# Patient Record
Sex: Male | Born: 1975 | Race: White | Hispanic: No | Marital: Single | Smoking: Current every day smoker
Health system: Southern US, Community
[De-identification: ages and names within clinical notes are randomized; demographics above are authoritative.]

## PROBLEM LIST (undated history)

## (undated) DIAGNOSIS — E663 Overweight: Secondary | ICD-10-CM

## (undated) DIAGNOSIS — J45909 Unspecified asthma, uncomplicated: Secondary | ICD-10-CM

## (undated) DIAGNOSIS — R030 Elevated blood-pressure reading, without diagnosis of hypertension: Secondary | ICD-10-CM

## (undated) DIAGNOSIS — Z9189 Other specified personal risk factors, not elsewhere classified: Secondary | ICD-10-CM

## (undated) DIAGNOSIS — E785 Hyperlipidemia, unspecified: Secondary | ICD-10-CM

## (undated) DIAGNOSIS — F17200 Nicotine dependence, unspecified, uncomplicated: Secondary | ICD-10-CM

## (undated) HISTORY — DX: Other specified personal risk factors, not elsewhere classified: Z91.89

## (undated) HISTORY — DX: Elevated blood-pressure reading, without diagnosis of hypertension: R03.0

## (undated) HISTORY — DX: Hyperlipidemia, unspecified: E78.5

## (undated) HISTORY — DX: Unspecified asthma, uncomplicated: J45.909

## (undated) HISTORY — DX: Nicotine dependence, unspecified, uncomplicated: F17.200

## (undated) HISTORY — DX: Overweight: E66.3

## (undated) HISTORY — PX: TONSILLECTOMY: SUR1361

---

## 2008-04-03 ENCOUNTER — Emergency Department: Payer: Self-pay | Admitting: Emergency Medicine

## 2015-10-24 LAB — LIPID PANEL
Cholesterol: 227 — AB (ref 0–200)
HDL: 30 — AB (ref 35–70)
LDL Cholesterol: 126
Triglycerides: 357 — AB (ref 40–160)

## 2016-02-12 ENCOUNTER — Ambulatory Visit
Admission: RE | Admit: 2016-02-12 | Discharge: 2016-02-12 | Disposition: A | Discharge status: Home / Self Care | Payer: BLUE CROSS/BLUE SHIELD | Source: Ambulatory Visit | Attending: Family Medicine | Admitting: Family Medicine

## 2016-02-12 ENCOUNTER — Other Ambulatory Visit: Payer: Self-pay | Admitting: Family Medicine

## 2016-02-12 DIAGNOSIS — R1013 Epigastric pain: Secondary | ICD-10-CM | POA: Insufficient documentation

## 2016-02-12 MED ORDER — IOHEXOL 300 MG/ML  SOLN
125.0000 mL | Freq: Once | INTRAMUSCULAR | Status: AC | PRN
Start: 2016-02-12 — End: 2016-02-12
  Administered 2016-02-12: 125 mL via INTRAVENOUS

## 2017-09-02 IMAGING — CT CT ABD-PELV W/ CM
1 of 3 series · 14 of 32 positions shown, 19 images · IV contrast (omnipaque)
Comparison: None.

CLINICAL DATA: Epigastric to back pain x 2 days-worse with eating
and drinking, denies further symptoms, no hx of surgery.

EXAM:
CT ABDOMEN AND PELVIS WITH CONTRAST
TECHNIQUE: Multidetector CT imaging of the abdomen and pelvis was performed
using the standard protocol following bolus administration of
intravenous contrast.
CONTRAST:  125mL OMNIPAQUE IOHEXOL 300 MG/ML  SOLN

[Series 2: routine abd pel with · axial · 0.74mm/px · z∈[-632,-187]mm · 14 of 101 slices shown, 19 images]
[im 6/101  soft-tissue]
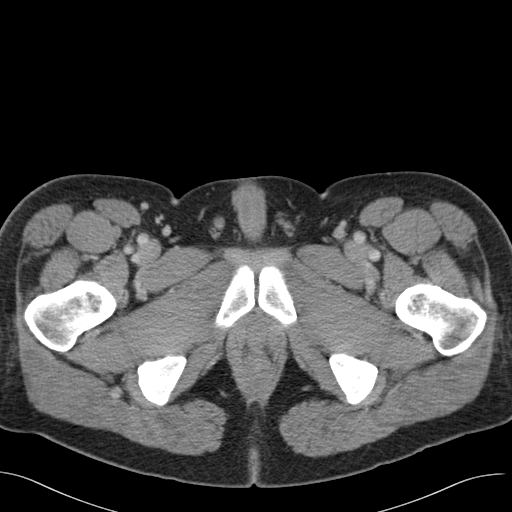
[im 6/101  bone]
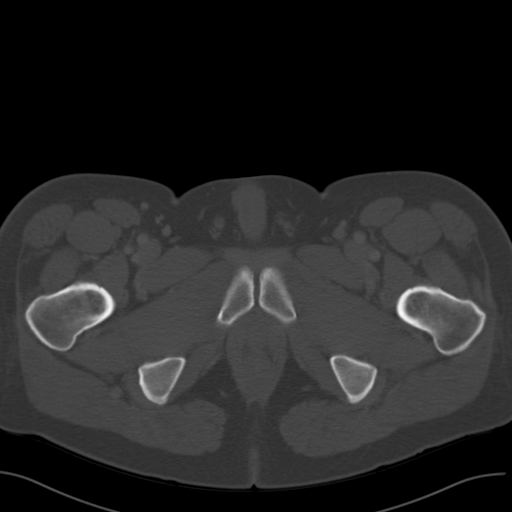
[im 16/101  soft-tissue]
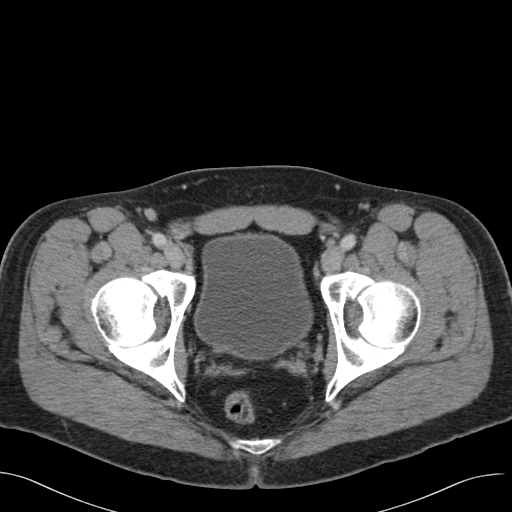
[im 22/101  soft-tissue]
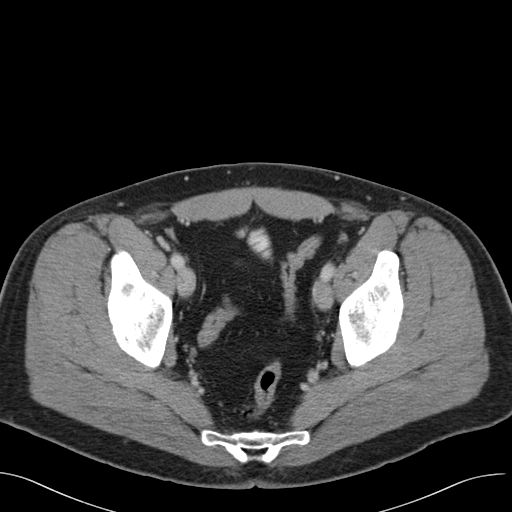
[im 27/101  soft-tissue]
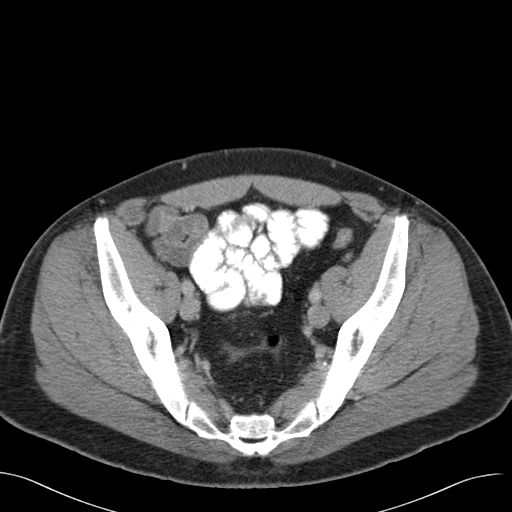
[im 37/101  soft-tissue]
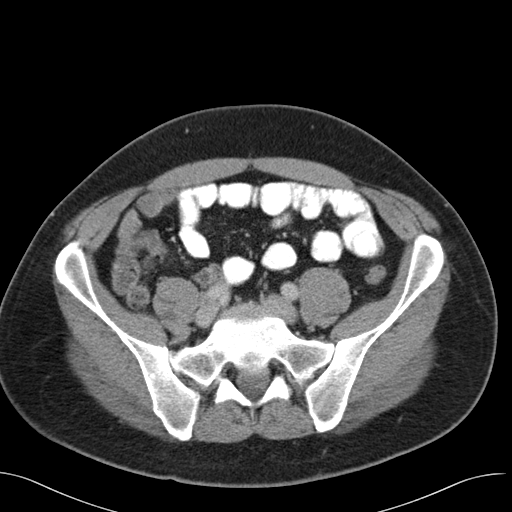
[im 43/101  soft-tissue]
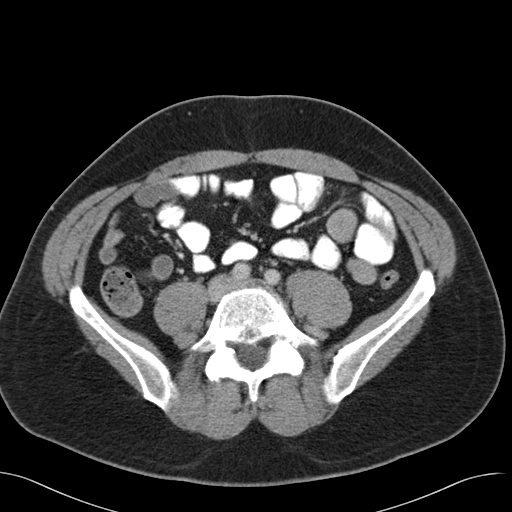
[im 53/101  soft-tissue]
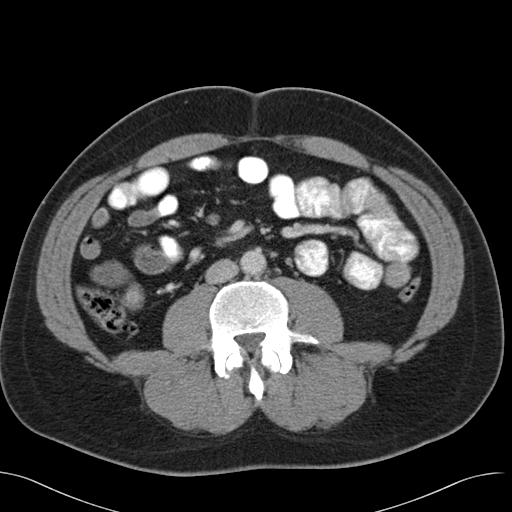
[im 58/101  soft-tissue]
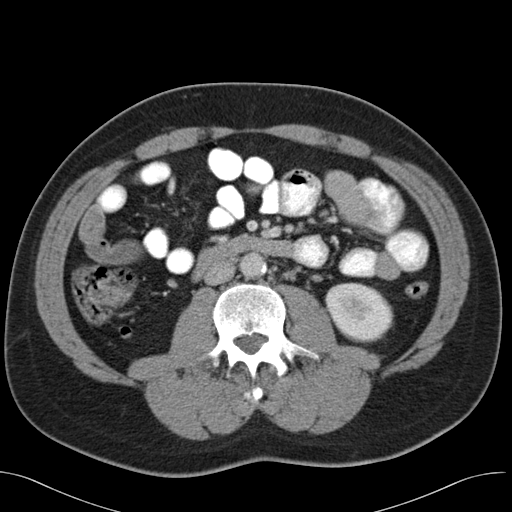
[im 64/101  soft-tissue]
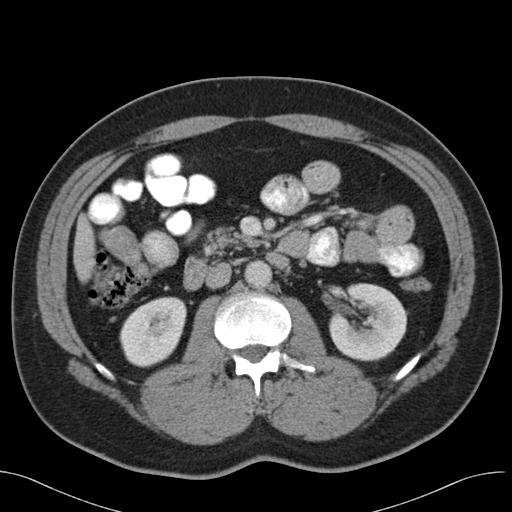
[im 64/101  bone]
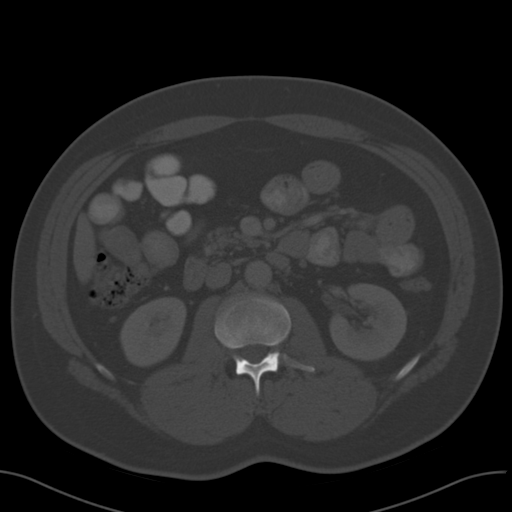
[im 74/101  soft-tissue]
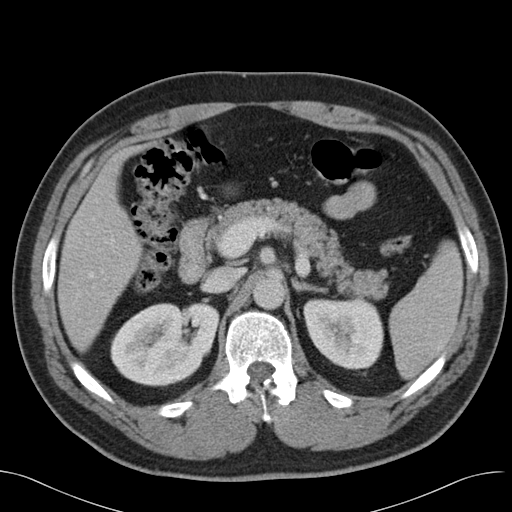
[im 79/101  soft-tissue]
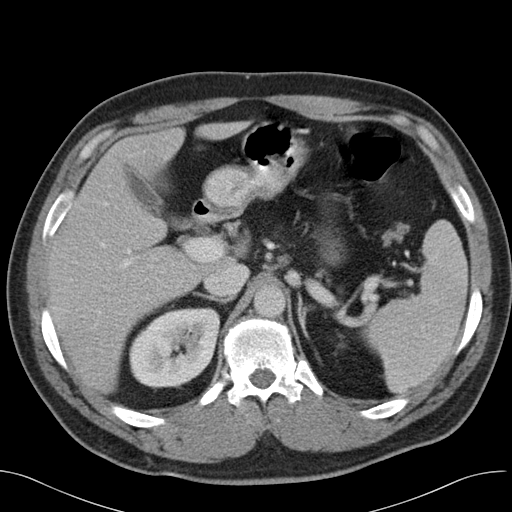
[im 79/101  lung]
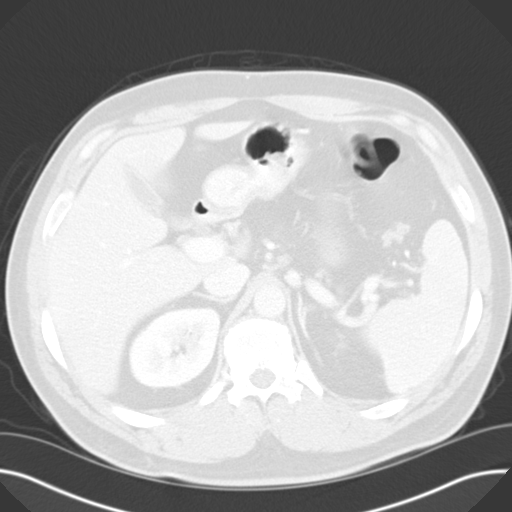
[im 85/101  soft-tissue]
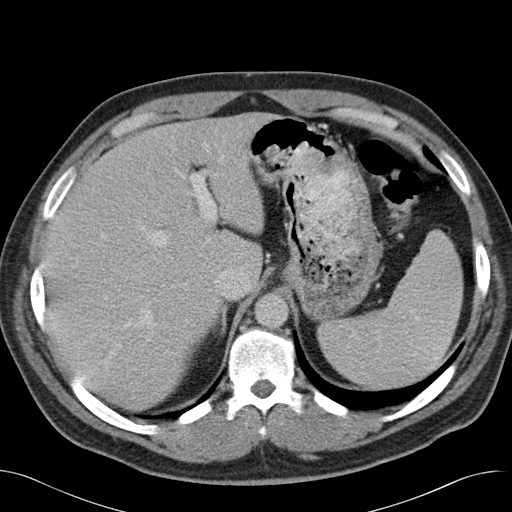
[im 85/101  lung]
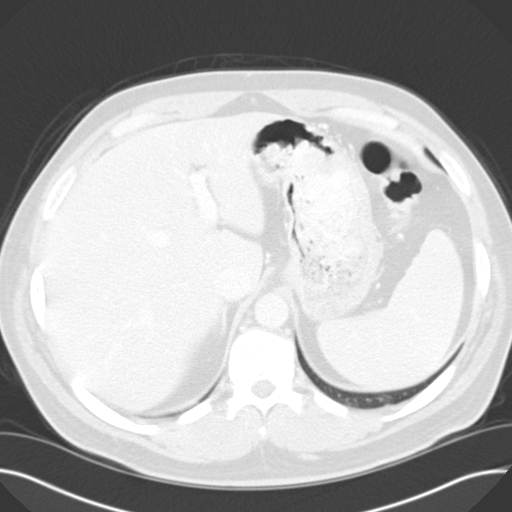
[im 90/101  lung]
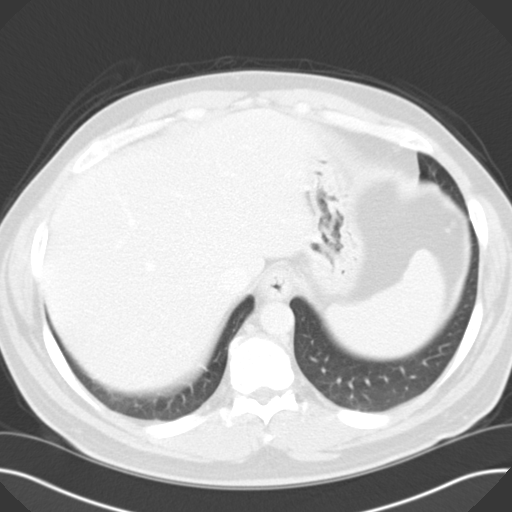
[im 95/101  soft-tissue]
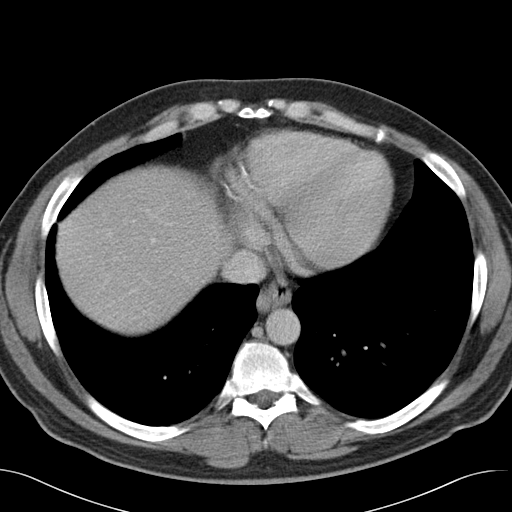
[im 95/101  lung]
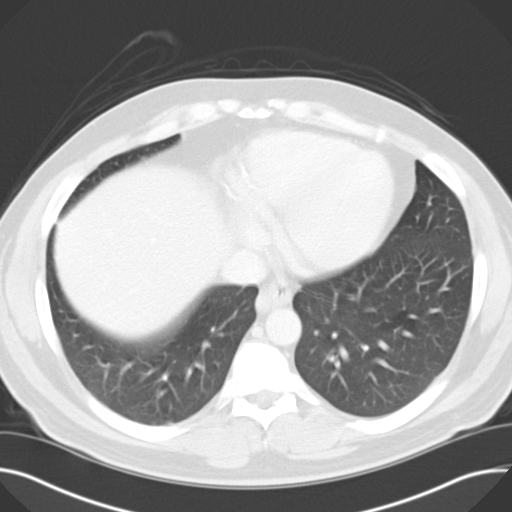

[14 of 32 positions shown; findings below may reference images not displayed]

FINDINGS: Lung bases:  Clear.  Heart normal size.

Liver, spleen, gallbladder, pancreas, adrenal glands:  Normal.

Kidneys, ureters, bladder:  Normal.

Lymph nodes: Borderline enlarged, 10 mm short axis, lymph node
adjacent to the right aspect of the distal esophagus, most likely
reactive. No other adenopathy.

Ascites: None

Gastrointestinal: Stomach is unremarkable. Small bowel and colon are
within normal limits. Normal appendix visualized.

Musculoskeletal:  Unremarkable.
IMPRESSION: 1. No acute findings. No findings to explain this patient's current
symptoms.
2. Essentially normal exam.  Normal appendix visualized.

## 2017-11-26 LAB — LIPID PANEL
Cholesterol: 233 — AB (ref 0–200)
HDL: 38 (ref 35–70)
LDL Cholesterol: 163
Triglycerides: 160 (ref 40–160)

## 2017-11-27 ENCOUNTER — Other Ambulatory Visit: Payer: Self-pay | Admitting: Family Medicine

## 2017-11-27 DIAGNOSIS — R079 Chest pain, unspecified: Secondary | ICD-10-CM

## 2017-12-01 ENCOUNTER — Encounter
Admission: RE | Admit: 2017-12-01 | Discharge: 2017-12-01 | Disposition: A | Discharge status: Home / Self Care | Payer: BLUE CROSS/BLUE SHIELD | Source: Ambulatory Visit | Attending: Family Medicine | Admitting: Family Medicine

## 2017-12-01 DIAGNOSIS — R079 Chest pain, unspecified: Secondary | ICD-10-CM | POA: Diagnosis not present

## 2017-12-01 MED ORDER — TECHNETIUM TC 99M TETROFOSMIN IV KIT
13.0000 | PACK | Freq: Once | INTRAVENOUS | Status: AC | PRN
  Administered 2017-12-01: 12.34 via INTRAVENOUS

## 2017-12-01 MED ORDER — TECHNETIUM TC 99M TETROFOSMIN IV KIT
30.0000 | PACK | Freq: Once | INTRAVENOUS | Status: AC | PRN
  Administered 2017-12-01: 31.7 via INTRAVENOUS

## 2017-12-02 ENCOUNTER — Encounter: Payer: Self-pay | Admitting: Cardiovascular Disease

## 2017-12-02 ENCOUNTER — Ambulatory Visit (INDEPENDENT_AMBULATORY_CARE_PROVIDER_SITE_OTHER): Payer: BLUE CROSS/BLUE SHIELD | Admitting: Cardiovascular Disease

## 2017-12-02 VITALS — BP 136/96 | HR 66 | Ht 75.5 in | Wt 258.8 lb

## 2017-12-02 DIAGNOSIS — Z720 Tobacco use: Secondary | ICD-10-CM

## 2017-12-02 DIAGNOSIS — R0602 Shortness of breath: Secondary | ICD-10-CM | POA: Diagnosis not present

## 2017-12-02 DIAGNOSIS — E785 Hyperlipidemia, unspecified: Secondary | ICD-10-CM

## 2017-12-02 LAB — NM MYOCAR MULTI W/SPECT W/WALL MOTION / EF
CHL CUP NUCLEAR SSS: 0
CHL CUP STRESS STAGE 1 HR: 72 {beats}/min
CHL CUP STRESS STAGE 2 DBP: 89 mmHg
CHL CUP STRESS STAGE 2 SPEED: 1.7 mph
CHL CUP STRESS STAGE 3 SPEED: 2.5 mph
CHL CUP STRESS STAGE 4 DBP: 102 mmHg
CHL CUP STRESS STAGE 4 GRADE: 14 %
CHL CUP STRESS STAGE 5 HR: 160 {beats}/min
CHL CUP STRESS STAGE 5 SPEED: 4.2 mph
CHL CUP STRESS STAGE 6 DBP: 91 mmHg
CHL CUP STRESS STAGE 6 GRADE: 0 %
CHL CUP STRESS STAGE 6 SBP: 231 mmHg
CHL CUP STRESS STAGE 6 SPEED: 0 mph
CHL CUP STRESS STAGE 7 DBP: 99 mmHg
CHL CUP STRESS STAGE 7 SPEED: 0 mph
CSEPED: 11 min
CSEPEDS: 53 s
CSEPEW: 13.4 METS
CSEPPHR: 160 {beats}/min
CSEPPMHR: 89 %
LV dias vol: 151 mL (ref 62–150)
LVSYSVOL: 74 mL
Rest HR: 86 {beats}/min
SDS: 0
SRS: 2
Stage 1 Grade: 0 %
Stage 1 Speed: 0.1 mph
Stage 2 Grade: 10 %
Stage 2 HR: 99 {beats}/min
Stage 2 SBP: 187 mmHg
Stage 3 Grade: 12 %
Stage 3 HR: 118 {beats}/min
Stage 4 HR: 134 {beats}/min
Stage 4 SBP: 209 mmHg
Stage 4 Speed: 3.4 mph
Stage 5 Grade: 16 %
Stage 6 HR: 127 {beats}/min
Stage 7 Grade: 0 %
Stage 7 HR: 88 {beats}/min
Stage 7 SBP: 183 mmHg
TID: 0.89

## 2017-12-02 NOTE — Patient Instructions (Addendum)
Medication Instructions:  Your physician recommends that you continue on your current medications as directed. Please refer to the Current Medication list given to you today.   Labwork: none  Testing/Procedures: Your physician has requested that you have an echocardiogram. Echocardiography is a painless test that uses sound waves to create images of your heart. It provides your doctor with information about the size and shape of your heart and how well your heart's chambers and valves are working. This procedure takes approximately one hour. There are no restrictions for this procedure.    Follow-Up: Your physician recommends that you schedule a follow-up appointment as needed.    Any Other Special Instructions Will Be Listed Below (If Applicable).     If you need a refill on your cardiac medications before your next appointment, please call your pharmacy.  Echocardiogram An echocardiogram, or echocardiography, uses sound waves (ultrasound) to produce an image of your heart. The echocardiogram is simple, painless, obtained within a short period of time, and offers valuable information to your health care provider. The images from an echocardiogram can provide information such as:  Evidence of coronary artery disease (CAD).  Heart size.  Heart muscle function.  Heart valve function.  Aneurysm detection.  Evidence of a past heart attack.  Fluid buildup around the heart.  Heart muscle thickening.  Assess heart valve function.  Tell a health care provider about:  Any allergies you have.  All medicines you are taking, including vitamins, herbs, eye drops, creams, and over-the-counter medicines.  Any problems you or family members have had with anesthetic medicines.  Any blood disorders you have.  Any surgeries you have had.  Any medical conditions you have.  Whether you are pregnant or may be pregnant. What happens before the procedure? No special preparation is  needed. Eat and drink normally. What happens during the procedure?  In order to produce an image of your heart, gel will be applied to your chest and a wand-like tool (transducer) will be moved over your chest. The gel will help transmit the sound waves from the transducer. The sound waves will harmlessly bounce off your heart to allow the heart images to be captured in real-time motion. These images will then be recorded.  You may need an IV to receive a medicine that improves the quality of the pictures. What happens after the procedure? You may return to your normal schedule including diet, activities, and medicines, unless your health care provider tells you otherwise. This information is not intended to replace advice given to you by your health care provider. Make sure you discuss any questions you have with your health care provider. Document Released: 11/29/2000 Document Revised: 07/20/2016 Document Reviewed: 08/09/2013 Elsevier Interactive Patient Education  2017 [employer] Inc.  

## 2017-12-02 NOTE — Progress Notes (Signed)
Cardiology Office Note   Date:  12/02/2017   ID:  Brandon Kim, DOB [birth year], MRN [id number]  PCP:  Brandon Kim, Brandon Kim, Brandon Kim  Cardiologist:   Brandon BearsMuhammad Arida, Brandon Kim   Chief Complaint  Patient presents with  . OTHER    Abn. stress test. Meds reviewed verbally with pt.      History of Present Illness: Brandon Kim is a 41 y.o. male who was referred by Dr. Ellin Goodieabinowitz for evaluation of an abnormal EKG and stress test.  The patient has no prior cardiac history.  He has known history of hyperlipidemia on atorvastatin and tobacco use.  He smokes 1 pack/day and has been doing so since he was in his early 1520s.  His mother had myocardial infarction at the age of 42 with subsequent CABG.  She died at the age of 42 of heart disease. The patient was recently noted to have an abnormal EKG.  He was referred for a treadmill nuclear stress test.  He was able to exercise for 13 minutes without symptoms.  Perfusion was suboptimal but showed a fixed inferior wall defect likely diaphragm attenuation.  Ejection fraction was read as 41%. The patient denies any chest pain, shortness of breath or palpitations.    Past Medical History:  Diagnosis Date  . Hyperlipidemia     History reviewed. No pertinent surgical history.   Current Outpatient Medications  Medication Sig Dispense Refill  . atorvastatin (LIPITOR) 20 MG tablet Take 20 mg by mouth at bedtime.  1   No current facility-administered medications for this visit.     Allergies:   Patient has no known allergies.    Social History:  The patient  reports that he has been smoking cigarettes.  He has a 20.00 pack-year smoking history. he has never used smokeless tobacco. He reports that he drinks alcohol. He reports that he does not use drugs.   Family History:  The patient's family history includes Heart attack in his mother; Heart disease in his mother.    ROS:  Please see the history of present illness.   Otherwise, review of systems are  positive for none.   All other systems are reviewed and negative.    PHYSICAL EXAM: VS:  BP (!) 136/96 (BP Location: Right Arm, Patient Position: Sitting, Cuff Size: Normal)   Pulse 66   Ht 6' 3.5" (1.918 m)   Wt 258 lb 12 oz (117.4 kg)   BMI 31.91 kg/m  , BMI Body mass index is 31.91 kg/m. GEN: Well nourished, well developed, in no acute distress  HEENT: normal  Neck: no JVD, carotid bruits, or masses Cardiac: RRR; no murmurs, rubs, or gallops,no edema  Respiratory:  clear to auscultation bilaterally, normal work of breathing GI: soft, nontender, nondistended, + BS MS: no deformity or atrophy  Skin: warm and dry, no rash Neuro:  Strength and sensation are intact Psych: euthymic mood, full affect   EKG:  EKG is ordered today. The ekg ordered today demonstrates normal sinus rhythm with no significant ST or T wave changes.   Recent Labs: No results found for requested labs within last 8760 hours.    Lipid Panel No results found for: CHOL, TRIG, HDL, CHOLHDL, VLDL, LDLCALC, LDLDIRECT    Wt Readings from Last 3 Encounters:  12/02/17 258 lb 12 oz (117.4 kg)  12/01/17 254 lb (115.2 kg)      Other studies Reviewed:   PAD Screen 12/02/2017  Previous PAD dx? No  Previous surgical procedure? No  Pain with walking? No  Feet/toe relief with dangling? No  Painful, non-healing ulcers? No  Extremities discolored? No      ASSESSMENT AND PLAN:  1.  Non specific abnormal cardiovascular testing: I reviewed the patient's EKG from today and it  is completely normal.  I personally reviewed his nuclear stress test images.  The patient was able to exercise for 13 minutes with no symptoms or EKG changes. Perfusion showed fixed inferior wall defect consistent with diaphragm attenuation artifact with no convincing evidence of ischemia or infarct. Ejection fraction was reported to be 41%.  However, I do suspect that this was not an accurate calculation based on poor tracking of LV wall  borders.  His left ventricle did appear to be mildly dilated.  I requested an echocardiogram to evaluate ejection fraction and wall motion. If echo shows normal ejection fraction and wall motion, no further cardiac workup is recommended. I discussed with him the importance of treating his risk factors.  2.  Hyperlipidemia: Currently on atorvastatin.  3.  Tobacco use: I discussed with him the importance of smoking cessation and he is determined to quit smoking.    Disposition:   FU with me as needed.   Signed,  Brandon BearsMuhammad Arida, Brandon Kim  12/02/2017 3:51 PM    [facility]

## 2017-12-04 ENCOUNTER — Ambulatory Visit (INDEPENDENT_AMBULATORY_CARE_PROVIDER_SITE_OTHER): Payer: BLUE CROSS/BLUE SHIELD

## 2017-12-04 ENCOUNTER — Other Ambulatory Visit: Payer: Self-pay

## 2017-12-04 DIAGNOSIS — R0602 Shortness of breath: Secondary | ICD-10-CM

## 2019-02-16 LAB — LIPID PANEL
Cholesterol: 264 — AB (ref 0–200)
HDL: 31 — AB (ref 35–70)
LDL Cholesterol: 197
Triglycerides: 178 — AB (ref 40–160)

## 2019-06-02 ENCOUNTER — Ambulatory Visit: Payer: Self-pay | Admitting: Internal Medicine

## 2019-08-10 ENCOUNTER — Encounter: Payer: Self-pay | Admitting: Physician Assistant

## 2019-08-10 ENCOUNTER — Other Ambulatory Visit: Payer: Self-pay

## 2019-08-10 ENCOUNTER — Ambulatory Visit: Payer: 59 | Admitting: Physician Assistant

## 2019-08-10 VITALS — BP 141/88 | HR 63 | Temp 98.7°F | Resp 16 | Ht 76.0 in | Wt 248.0 lb

## 2019-08-10 DIAGNOSIS — L247 Irritant contact dermatitis due to plants, except food: Secondary | ICD-10-CM

## 2019-08-10 MED ORDER — CETIRIZINE HCL 10 MG PO TABS: 10.0000 mg | ORAL_TABLET | Freq: Two times a day (BID) | ORAL | Status: DC

## 2019-08-10 MED ORDER — RANITIDINE HCL 75 MG PO TABS: 150.0000 mg | ORAL_TABLET | Freq: Two times a day (BID) | ORAL | 0 refills | Status: DC

## 2019-08-10 MED ORDER — DIPHENHYDRAMINE HCL 25 MG PO CAPS: 25.0000 mg | ORAL_CAPSULE | Freq: Every evening | ORAL | 0 refills | Status: DC | PRN

## 2019-08-10 NOTE — Progress Notes (Signed)
   Subjective:    Patient ID: Brandon Kim, male    DOB: 08/25/1976, 43 y.o.   MRN: [id number]  HPI  Clearing brush in yard about 7-10 days ago with pruritic rash noted on hands ,lower legs and midrif in the days following yard project. Had a few days of significant itching and discomfort which has now resolved  He has 2 blistered areas remaining on Left 4 and Right 4-5 fingers. These areas are reportedly improved over yesterday and moreso over days before.  No longer itch and ROM now normal after previously being limited by some swelling by his report  [employer] ..his reference "diet cigarettes" and had one on way to office. BP elevated on arrival and improved on retake. Good example of cause effect discussed     Review of Systems Negative except as described above    Objective:   Physical Exam   WDWN male with aging dermatitis noted on fingers Left 4 and right 5-6. Blisters present but drying , erythematous bases persist. ROM good bilaterally Areas reported as involved on his legs and side have resolved and no longer visible          Assessment & Plan:   DIscussed option of prednisone taper but given limited area of concern and his report of steady improvement he prefers to use topical intervention.   Desire is to get back outside and finish his project. Outbreak needs to be protected because of skin fragility and allowed to heal. Encourage waiting a few more days for project. In the interim may use topical Bacitracin to keep soft and minimize infection until skin integrity improves. Finger wrap bandaids can be utilized with the Ointment. Garden gloves strongly encouraged for present and future  Smoking cessation encouraged but his past history yielded a significant weight gain last time he stopped. Stepwise reduction with a focus on an exercise routine that can be managed and consistent encouraged.

## 2019-08-24 ENCOUNTER — Other Ambulatory Visit: Payer: Self-pay

## 2019-08-24 ENCOUNTER — Ambulatory Visit: Payer: 59 | Admitting: Internal Medicine

## 2019-08-24 ENCOUNTER — Encounter: Payer: Self-pay | Admitting: Internal Medicine

## 2019-08-24 VITALS — BP 173/115 | HR 81 | Temp 98.9°F | Resp 14 | Ht 76.0 in | Wt 266.0 lb

## 2019-08-24 DIAGNOSIS — R030 Elevated blood-pressure reading, without diagnosis of hypertension: Secondary | ICD-10-CM | POA: Insufficient documentation

## 2019-08-24 DIAGNOSIS — H5789 Other specified disorders of eye and adnexa: Secondary | ICD-10-CM | POA: Insufficient documentation

## 2019-08-24 DIAGNOSIS — S50319A Abrasion of unspecified elbow, initial encounter: Secondary | ICD-10-CM | POA: Insufficient documentation

## 2019-08-24 NOTE — Progress Notes (Signed)
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  It was a thicker glass with out much fine shards that resulted, more of just a break and a laceration.  He noted he washed it out very good after, made sure there was no glass in any cuts, and did not go to an emergency setting as he was concerned about the cost and insurance.  He had 2 small flap areas that resulted from this, that he was aware may have needed sutures, although as we discussed, cannot be sutured today given the time duration since the event.  He notes that it has remained keeping it clean, no marked redness around it, still is uncomfortable around the 2 flap type areas and from the fall landing on his elbow, although it is not a pain problem presently.  He wanted to get checked to make sure there was no infection concerns and to see if he may need antibiotics.  He does not have a history of high blood pressure, is not on medicines for that, and was seen here on 8/25 by the PA, and his blood pressure then was 141/88.  He also noted that he could not find his contact on his right eye.  It did not feel like something was still in the eye, although he irrigated the eye out after this happened, and he did not find his contact, although he does not think it is still in his eye, although was not sure of that.  It today is a little sore in the corner of the eye, although that could be from him thoroughly irrigating and trying to make sure there was no contact that remained in place.  His eye was not red.  It also was not painful.  No Known Allergies Current Outpatient Medications on File Prior to Visit  Medication Sig Dispense Refill  . atorvastatin (LIPITOR) 40 MG tablet Take 40 mg by mouth daily.    . cetirizine (ZYRTEC) 10 MG tablet Take 1 tablet (10 mg total) by mouth 2 (two) times daily for 3 days.     . diphenhydrAMINE (BENADRYL ALLERGY) 25 mg capsule Take 1-2 capsules (25-50 mg total) by mouth at bedtime as needed for up to 3 days for itching.  0  . ranitidine (ZANTAC 75) 75 MG tablet Take 2 tablets (150 mg total) by mouth 2 (two) times daily for 3 days.  0   No current facility-administered medications on file prior to visit.    He is a current every day smoker.    Last Tdap - 2014  O - NAD, masked  BP (!) 173/115 (BP Location: Right Arm, Patient Position: Sitting, Cuff Size: Large)   Pulse 81   Temp 98.9 F (37.2 C) (Oral)   Resp 14   Ht 6\' 4"  (1.93 m)   Wt 266 lb (120.7 kg)   SpO2 98%   BMI 32.38 kg/m   BP 141/88 on visit 08/10/2019  H EENT- the right eye was not injected, with the pupil equal and reactive, the upper lid was inverted, with no obvious contact located, and in the very right corner of the eye, beneath the lid looked more like a fold than a contact.  I thoroughly irrigated the eye with sterile saline several times, with no foreign body like a contact removed.  The upper  outer lid had some faint erythema and was sore more than tender palpating, with a question of some very slight irritation/swelling on that very distal outer lid region. The left eye had the contact in place with the conjunctiva noninjected.  LUE -the left elbow had good range of motion without marked swelling.  To semicircular abrasions were present, the larger 1 above the elbow in the tricep region and the smaller one even more cephalad, with both having flaps that were in place and well approximated, with no marked erythema surrounding.  There was no purulent drainage, mildly tender palpating the area surrounding the semicircular abrasions.  There were scattered superficial abrasions adjacent to these 2 regions His sensation was intact in the distal left upper extremity. He had tattoos on his upper extremities as well.  His affect was not flat, he was very appropriate with  conversation.   Ass/Plan:  1. Abrasions, left elbow (happened Sunday) -given the timeframe from when the lacerations occurred, we could not suture them today and he was aware of that.  The 2 flap areas seem to be healing quite nicely, and with Leisha's help.  The areas were cleansed, and Steri-Strips applied to reinforce the 2 flap areas and then loosely covered, and would keep loosely covered in the short-term.  Also to keep clean with warm soapy water recommended.  A tetanus booster was felt needed as his last tetanus was in 2014, and that was given today. To follow-up immediately if any signs of infection or rising, and that was reviewed today.  Do not feel adding an empiric antibiotic is needed at this point.  2. Increased BP with out the dx of HTN -concerned with his blood pressure today, although reassured that he was just here in late August and his blood pressure was borderline at that point, much better than today.  Do feel the events above contributed to his pressure being up some today.  We will continue to monitor at this point, but do feel follow-up blood pressure is in order here in the near future, and that can be obtained.  3. Right eye irritation - unclear if contact out noted by patient, not feel like something is in the eye and he did irrigate it out and did not find contact after.  There was no obvious contact still present in the eye or other foreign body, although did note that sometimes these can fold and really be hard to identify, especially in the corner of the eye.  He was also aware that.  Emphasized if it starts to feel like there may be something more in the eye, to irrigate with sterile saline eye wash given to him to do so today, and would follow-up with his eye doctor as he notes he has one that he sees routinely.  Also, if he is starting to get more irritation or more redness of the eye, I would recommend being seen as well.

## 2019-08-24 NOTE — Patient Instructions (Signed)
Abrasion  An abrasion is a cut or a scrape on the surface of your skin. An abrasion does not go through all the layers of your skin. It is important to take good care of your abrasion to prevent infection. Follow these instructions at home: Medicines  Take or apply over-the-counter and prescription medicines only as told by your doctor.  If you were prescribed an antibiotic medicine, apply it as told by your doctor. Do not stop using the antibiotic even if you start to feel better. Wound care  Clean the wound 2-3 times a day or as often as told by your doctor. To do this: ? Wash the wound with mild soap and water. ? Rinse off the soap. ? Pat a clean towel on the wound to dry it. Do not rub it.  Keep the bandage (dressing) clean and dry as told by your doctor.  Follow instructions from your doctor about how to take care of your wound. Make sure you: ? Wash your hands with soap and water before you change your bandage. If you cannot use soap and water, use hand sanitizer. ? Change your bandage as told by your doctor.  Check your wound every day for signs of infection. Check for: ? Redness, swelling, or pain. ? Fluid or blood. ? Warmth. ? Pus or a bad smell.  If directed, put ice on the injured area. To do this: ? Put ice in a plastic bag. ? Place a towel between your skin and the bag. ? Leave the ice on for 20 minutes, 2-3 times a day. General instructions  Do not take baths, swim, or use a hot tub until your doctor says it is okay.  If there is swelling, raise (elevate) the injured area above the level of your heart while you are sitting or lying down.  Keep all follow-up visits as told by your doctor. This is important. Contact a doctor if:  You were given a tetanus shot, and you have any of these where the needle went in: ? Swelling. ? Very bad pain. ? Redness. ? Bleeding.  You have a lot of pain, and medicine does not help.  You have any of these at the site of the  wound: ? More redness. ? More swelling. ? More pain. Get help right away if:  You have a red streak going away from your wound.  You have a fever.  You have fluid, blood, or pus coming from your wound.  There is a bad smell coming from your wound or bandage. Summary  An abrasion is a cut or a scrape on the surface of your skin.  Take good care of your abrasion so it does not get infected.  Clean the wound with mild soap and water, and change your bandage as told by your doctor.  Call your doctor if you have redness, swelling, or more pain in your wound.  Get help right away if you have a fever or if you have fluid, blood, pus, a bad smell, or a red streak coming from the wound. This information is not intended to replace advice given to you by your health care provider. Make sure you discuss any questions you have with your health care provider. Document Released: 05/20/2008 Document Revised: 11/14/2017 Document Reviewed: 07/24/2017 Elsevier Patient Education  2020 Elsevier Inc.  

## 2019-11-26 DIAGNOSIS — Z20828 Contact with and (suspected) exposure to other viral communicable diseases: Secondary | ICD-10-CM | POA: Diagnosis not present

## 2019-12-03 DIAGNOSIS — Z20828 Contact with and (suspected) exposure to other viral communicable diseases: Secondary | ICD-10-CM | POA: Diagnosis not present

## 2020-03-03 DIAGNOSIS — H52223 Regular astigmatism, bilateral: Secondary | ICD-10-CM | POA: Diagnosis not present

## 2020-04-11 ENCOUNTER — Encounter: Payer: Self-pay | Admitting: Physician Assistant

## 2020-04-11 ENCOUNTER — Ambulatory Visit: Payer: 59 | Admitting: Physician Assistant

## 2020-04-11 ENCOUNTER — Other Ambulatory Visit: Payer: Self-pay

## 2020-04-11 VITALS — BP 141/93 | HR 88 | Temp 97.3°F | Resp 16 | Ht 75.5 in | Wt 243.0 lb

## 2020-04-11 DIAGNOSIS — W57XXXA Bitten or stung by nonvenomous insect and other nonvenomous arthropods, initial encounter: Secondary | ICD-10-CM

## 2020-04-11 MED ORDER — SULFAMETHOXAZOLE-TRIMETHOPRIM 800-160 MG PO TABS: 1.0000 | ORAL_TABLET | Freq: Two times a day (BID) | ORAL | 0 refills | Status: DC

## 2020-04-11 MED ORDER — HYDROXYZINE HCL 50 MG PO TABS
50.0000 mg | ORAL_TABLET | Freq: Three times a day (TID) | ORAL | 0 refills | Status: DC | PRN
Start: 2020-04-11 — End: 2020-11-27

## 2020-04-11 NOTE — Progress Notes (Signed)
Just smoke a cigarette prior to coming to clinic.  Noticed redness & swelling to right inner forearm this morning when he woke up.  Reddened area approximately 3.5 inches diameter with dark center.  Swollen & warm to touch. Tender to touch.  States he was working outside yesterday doing yard work.  AMD

## 2020-04-11 NOTE — Progress Notes (Signed)
   Subjective: Insect bite    Patient ID: Brandon Kim, male    DOB: 1976/06/25, 44 y.o.   MRN: [id number]  HPI Patient presents insect bite to the right forearm.  Patient state he was doing yard work yesterday.  Patient did not feel the bite.  Working this morning with redness and swelling to the right forearm.  Patient states there is mild pain which he rates at 3/10.  Patient denies loss of sensation or function.   Review of Systems Hyperlipidemia and seasonal allergies.    Objective:   Physical Exam No acute distress.  There is a puncture wound on erythematous base.  Erythema measures approximately 6 cm.       Assessment & Plan:  Infected insect bite.  Patient given discharge care instruction advised take medication as directed.  Return back if condition worsens.

## 2020-05-03 ENCOUNTER — Other Ambulatory Visit: Payer: Self-pay | Admitting: Physician Assistant

## 2020-05-12 NOTE — Progress Notes (Signed)
Scheduled to complete physical 05/23/20.  Provider TBD  AMD 

## 2020-05-16 ENCOUNTER — Other Ambulatory Visit: Payer: Self-pay

## 2020-05-16 ENCOUNTER — Ambulatory Visit: Payer: Self-pay

## 2020-05-16 DIAGNOSIS — Z0000 Encounter for general adult medical examination without abnormal findings: Secondary | ICD-10-CM

## 2020-05-16 LAB — POCT URINALYSIS DIPSTICK
Bilirubin, UA: NEGATIVE
Blood, UA: NEGATIVE
Glucose, UA: NEGATIVE
Ketones, UA: NEGATIVE
Leukocytes, UA: NEGATIVE
Nitrite, UA: NEGATIVE
Protein, UA: POSITIVE — AB
Spec Grav, UA: >=1.03 — AB (ref 1.010–1.025)
Urobilinogen, UA: 0.2 E.U./dL
pH, UA: 5.5 (ref 5.0–8.0)

## 2020-05-17 LAB — CMP12+LP+TP+TSH+6AC+PSA+CBC…
ALT: 31 IU/L (ref 0–44)
AST: 18 IU/L (ref 0–40)
Albumin/Globulin Ratio: 1.9 (ref 1.2–2.2)
Albumin: 4.3 g/dL (ref 4.0–5.0)
Alkaline Phosphatase: 78 IU/L (ref 48–121)
BUN/Creatinine Ratio: 17 (ref 9–20)
BUN: 15 mg/dL (ref 6–24)
Basophils Absolute: 0.1 10*3/uL (ref 0.0–0.2)
Basos: 1 %
Bilirubin Total: 0.5 mg/dL (ref 0.0–1.2)
Calcium: 9.2 mg/dL (ref 8.7–10.2)
Chloride: 106 mmol/L (ref 96–106)
Chol/HDL Ratio: 5.3 ratio — ABNORMAL HIGH (ref 0.0–5.0)
Cholesterol, Total: 221 mg/dL — ABNORMAL HIGH (ref 100–199)
Creatinine, Ser: 0.9 mg/dL (ref 0.76–1.27)
EOS (ABSOLUTE): 0.1 10*3/uL (ref 0.0–0.4)
Eos: 2 %
Estimated CHD Risk: 1.1 times avg. — ABNORMAL HIGH (ref 0.0–1.0)
Free Thyroxine Index: 1.7 (ref 1.2–4.9)
GFR calc Af Amer: 120 mL/min/{1.73_m2} (ref >59)
GFR calc non Af Amer: 104 mL/min/{1.73_m2} (ref >59)
GGT: 74 IU/L — ABNORMAL HIGH (ref 0–65)
Globulin, Total: 2.3 g/dL (ref 1.5–4.5)
Glucose: 92 mg/dL (ref 65–99)
HDL: 42 mg/dL (ref >39)
Hematocrit: 44.3 % (ref 37.5–51.0)
Hemoglobin: 15.2 g/dL (ref 13.0–17.7)
Immature Grans (Abs): 0 10*3/uL (ref 0.0–0.1)
Immature Granulocytes: 0 %
Iron: 122 ug/dL (ref 38–169)
LDH: 162 IU/L (ref 121–224)
LDL Chol Calc (NIH): 138 mg/dL — ABNORMAL HIGH (ref 0–99)
Lymphocytes Absolute: 0.8 10*3/uL (ref 0.7–3.1)
Lymphs: 15 %
MCH: 31.3 pg (ref 26.6–33.0)
MCHC: 34.3 g/dL (ref 31.5–35.7)
MCV: 91 fL (ref 79–97)
Monocytes Absolute: 1.4 10*3/uL — ABNORMAL HIGH (ref 0.1–0.9)
Monocytes: 26 %
Neutrophils Absolute: 3.1 10*3/uL (ref 1.4–7.0)
Neutrophils: 56 %
Phosphorus: 3.1 mg/dL (ref 2.8–4.1)
Platelets: 195 10*3/uL (ref 150–450)
Potassium: 4.2 mmol/L (ref 3.5–5.2)
Prostate Specific Ag, Serum: 0.4 ng/mL (ref 0.0–4.0)
RBC: 4.85 x10E6/uL (ref 4.14–5.80)
RDW: 12.8 % (ref 11.6–15.4)
Sodium: 142 mmol/L (ref 134–144)
T3 Uptake Ratio: 28 % (ref 24–39)
T4, Total: 6.1 ug/dL (ref 4.5–12.0)
TSH: 1.47 u[IU]/mL (ref 0.450–4.500)
Total Protein: 6.6 g/dL (ref 6.0–8.5)
Triglycerides: 230 mg/dL — ABNORMAL HIGH (ref 0–149)
Uric Acid: 7.6 mg/dL (ref 3.8–8.4)
VLDL Cholesterol Cal: 41 mg/dL — ABNORMAL HIGH (ref 5–40)
WBC: 5.4 10*3/uL (ref 3.4–10.8)

## 2020-05-22 ENCOUNTER — Other Ambulatory Visit: Payer: Self-pay

## 2020-05-22 ENCOUNTER — Encounter: Payer: Self-pay | Admitting: Physician Assistant

## 2020-05-22 ENCOUNTER — Ambulatory Visit: Payer: Self-pay | Admitting: Physician Assistant

## 2020-05-22 VITALS — BP 160/80 | HR 81 | Temp 98.9°F | Resp 16 | Ht 76.0 in | Wt 250.0 lb

## 2020-05-22 DIAGNOSIS — Z0000 Encounter for general adult medical examination without abnormal findings: Secondary | ICD-10-CM

## 2020-05-22 NOTE — Progress Notes (Signed)
° °  Subjective: Annual physical exam    Patient ID: Brandon Kim, male    DOB: 1976-03-07, 44 y.o.   MRN: [id number]  HPI Patient reports annual physical exam.  Patient voices no concerns or complaints.   Review of Systems    Hyperlipidemia and hypertension. Objective:   Physical Exam No acute distress.  Elevated blood pressure.  HEENT is unremarkable.  Neck is supple without adenopathy or bruits.  Lungs are clear to auscultation.  Heart regular rate and rhythm.  Abdomen with negative HSM, normoactive bowel sounds, soft, nontender palpation.  No obvious deformity to the upper or lower.  Patient has full and equal range of motion of the upper and lower extremities.  No obvious cervical or lumbar spine deformity.  Patient has full equal range of motion of the cervical lumbar spine.  Cranial nerves II through XII grossly intact.       Assessment & Plan: Well exam.  Discussed patient lab results shown elevated cholesterol and triglycerides.  Patient state he has been off his medications for approximately 2-1/2 months.  Advised to read start Lipitor 40 mg daily and have a 3-day blood pressure check.

## 2020-06-16 ENCOUNTER — Other Ambulatory Visit: Payer: Self-pay

## 2020-06-16 ENCOUNTER — Ambulatory Visit: Payer: Self-pay

## 2020-06-16 VITALS — BP 135/94 | HR 87

## 2020-06-16 DIAGNOSIS — R030 Elevated blood-pressure reading, without diagnosis of hypertension: Secondary | ICD-10-CM

## 2020-06-16 NOTE — Progress Notes (Signed)
Patient to clinic for BP check.  Reports he just ate lunch at [employer] prior to coming to clinic and has been drinking coffee today.  Educated on [employer] and given fitness tracker.  Patient verbalized understanding.

## 2020-08-01 DIAGNOSIS — Z20822 Contact with and (suspected) exposure to covid-19: Secondary | ICD-10-CM | POA: Diagnosis not present

## 2020-11-06 ENCOUNTER — Other Ambulatory Visit: Payer: Self-pay

## 2020-11-06 MED ORDER — ATORVASTATIN CALCIUM 40 MG PO TABS: 40.0000 mg | ORAL_TABLET | Freq: Every day | ORAL | 2 refills | Status: DC

## 2020-11-27 ENCOUNTER — Other Ambulatory Visit: Payer: Self-pay

## 2020-11-27 ENCOUNTER — Ambulatory Visit: Payer: Self-pay | Admitting: Nurse Practitioner

## 2020-11-27 ENCOUNTER — Encounter: Payer: Self-pay | Admitting: Nurse Practitioner

## 2020-11-27 VITALS — HR 95 | Temp 98.7°F | Resp 14 | Ht 76.0 in | Wt 246.0 lb

## 2020-11-27 DIAGNOSIS — R21 Rash and other nonspecific skin eruption: Secondary | ICD-10-CM

## 2020-11-27 NOTE — Progress Notes (Signed)
   Subjective:    Patient ID: Brandon Kim, male    DOB: May 24, 1976, 44 y.o.   MRN: [id number]  HPI  44 year old male presenting with complaints of  Burning in central upper back between shoulder blades for the past 2+ months without known injury. Has not placed anything topical, denies any known burn to skin  Sensation of burning comes and goes  Does not prevent sleep or any daily activity   Concentrated to just behind shoulders and upper back  He does wear a vest at work at times, works for [employer]  Denies any new supplements or medications Takes Lipitor and has for years without SEs  History of Asthma/Allergies   Today's Vitals   11/27/20 1316  Pulse: 95  Resp: 14  Temp: 98.7 F (37.1 C)  SpO2: 95%  Weight: 246 lb (111.6 kg)  Height: 6\' 4"  (1.93 m)   Body mass index is 29.94 kg/m.  BP 140/90 Rechecked after patient rested in room   Review of Systems  Constitutional: Negative.   Endocrine: Negative.   Musculoskeletal: Negative.   Skin: Positive for rash.  Neurological: Negative.   Hematological: Negative.    Past Medical History:  Diagnosis Date  . Asthma   . Elevated BP without diagnosis of hypertension   . Hyperlipidemia   . Overweight   . Sedentary lifestyle   . Tobacco dependency       Objective:   Physical Exam HENT:     Head: Normocephalic.  Skin:    General: Skin is warm and dry.     Findings: Rash present. Rash is vesicular.          Comments: Anterior left shoulder with macular erythematous dry circular patches Central back erythematous/dry scattered vesicular rash centralized to spinal region crosses midline. Skin distal to rash on back/lower is dry & flaky    Neurological:     General: No focal deficit present.     Mental Status: He is alert and oriented to person, place, and time.           Assessment & Plan:  1. Rash- potentially started as shingles with secondary eczema flair  Anterior rash and posterior  rash appear different in nature. Will refer to Dermatology for microscopic evaluation and treatment.  Advised ointment based moisturizer (Aquafor) for now, if this makes symptoms worse advised to stop- will hold off on Rx topical until Dermatology recommendation  2. HTN, gave patient home log sheet to complete, will RTC for BP check and to review home BP log. If consistently 140/90 or above will start Rx for HTN as discussed. Has previously had increased BP in office with normal reading at home and on recheck.

## 2020-12-04 ENCOUNTER — Other Ambulatory Visit: Payer: Self-pay

## 2020-12-04 ENCOUNTER — Ambulatory Visit: Payer: Self-pay

## 2020-12-04 VITALS — BP 140/100

## 2020-12-04 DIAGNOSIS — Z0130 Encounter for examination of blood pressure without abnormal findings: Secondary | ICD-10-CM

## 2020-12-04 NOTE — Progress Notes (Signed)
Presents to [facility] for BP check.  States he's been checking it at home with home monitor & getting all kinds of elevated blood pressure readings.  Jeanie Cooks, CMA checked with automatic cuff & BP was 142/102.  Had Brett Canales to sit about 15 minutes & recheck.  Rechecked BP manually & it was 140/100.  Encouraged to increase water intake and decrease on sodium.  Return to clinic 2-3 more times for nurse BP checks.  Bring in home BP cuff for comparison.  Depending on the readings, will have clinic provider to evaluate them.  AMD

## 2020-12-05 ENCOUNTER — Ambulatory Visit: Payer: Self-pay

## 2020-12-05 VITALS — BP 150/100

## 2020-12-05 DIAGNOSIS — Z0130 Encounter for examination of blood pressure without abnormal findings: Secondary | ICD-10-CM

## 2020-12-05 NOTE — Progress Notes (Signed)
Pt presented today for 2nd BP visit. Pt brough bp cuff to compare.  With his electric: 162/108 : sitting: Lft Arm  Clinic Electric BP: 157/102: Sitting: Lft Arm  Clinic manual BP: 150/100 Sitting Lft arm  Clinic Electric BP: 157/100 Sitting Right Arm  Each BP 3-12 minutes apart.   Brandon Kim

## 2020-12-06 ENCOUNTER — Other Ambulatory Visit: Payer: Self-pay

## 2020-12-06 ENCOUNTER — Encounter: Payer: Self-pay | Admitting: Physician Assistant

## 2020-12-06 ENCOUNTER — Ambulatory Visit: Payer: Self-pay | Admitting: Physician Assistant

## 2020-12-06 VITALS — BP 140/90 | HR 100 | Temp 97.8°F | Resp 14 | Ht 76.0 in | Wt 248.0 lb

## 2020-12-06 DIAGNOSIS — I10 Essential (primary) hypertension: Secondary | ICD-10-CM

## 2020-12-06 MED ORDER — TRIAMTERENE-HCTZ 37.5-25 MG PO TABS: 1.0000 | ORAL_TABLET | Freq: Every day | ORAL | 3 refills | Status: DC

## 2020-12-06 NOTE — Progress Notes (Signed)
   Subjective: Hypertension    Patient ID: Brandon Kim, male    DOB: 06-10-1976, 43 y.o.   MRN: [id number]  HPI F/u visit 44y male hx of htn, hyperlipidemia Patient states he feels well Elevated bp have been without clinical symptom   Review of Systems Denies CVS, Neuro sxs     Objective:   Physical Exam  wn wf male in nad Heent wnl Pulm/ Cardiac exam unchanged Abd/Gu deferred Neuro grossly intact       Assessment & Plan:  Home monitor closely follows clinic BP reading Discussed Htn Give instruction on monitoring and  restricting sodium   Will add Maxzide diuretic and reelevate in 1wk

## 2020-12-13 ENCOUNTER — Ambulatory Visit: Payer: Self-pay

## 2020-12-13 ENCOUNTER — Other Ambulatory Visit: Payer: Self-pay

## 2020-12-13 VITALS — BP 132/96

## 2020-12-13 DIAGNOSIS — Z0130 Encounter for examination of blood pressure without abnormal findings: Secondary | ICD-10-CM

## 2020-12-25 ENCOUNTER — Other Ambulatory Visit: Payer: Self-pay

## 2020-12-25 VITALS — BP 144/100 | HR 85 | Resp 12

## 2020-12-25 DIAGNOSIS — J020 Streptococcal pharyngitis: Secondary | ICD-10-CM

## 2020-12-25 LAB — POCT RAPID STREP A (OFFICE): Rapid Strep A Screen: NEGATIVE

## 2020-12-25 NOTE — Progress Notes (Signed)
Called requesting quick strep test.  States wife Dx'd & Tx'd for Strep throat a couple of weeks ago.  Over the weekend reports throat was sore & hurt to swallow.  Today it's not hurting, but still wanted a strep test.  POCT Strep Test = Negative  Enc'd to return is sore throat worsens.  BP check - Elevated States hasn't taken his BP med today.  AMD

## 2021-01-03 ENCOUNTER — Other Ambulatory Visit: Payer: Self-pay

## 2021-01-03 ENCOUNTER — Ambulatory Visit: Payer: Self-pay

## 2021-01-03 DIAGNOSIS — Z01818 Encounter for other preprocedural examination: Secondary | ICD-10-CM

## 2021-01-03 LAB — POCT URINALYSIS DIPSTICK
Bilirubin, UA: NEGATIVE
Blood, UA: NEGATIVE
Glucose, UA: NEGATIVE
Ketones, UA: NEGATIVE
Leukocytes, UA: NEGATIVE
Nitrite, UA: NEGATIVE
Protein, UA: POSITIVE — AB
Spec Grav, UA: >=1.03 — AB (ref 1.010–1.025)
Urobilinogen, UA: 0.2 E.U./dL
pH, UA: 5.5 (ref 5.0–8.0)

## 2021-01-04 LAB — CMP12+LP+TP+TSH+6AC+PSA+CBC…
ALT: 58 IU/L — ABNORMAL HIGH (ref 0–44)
AST: 33 IU/L (ref 0–40)
Albumin/Globulin Ratio: 1.5 (ref 1.2–2.2)
Albumin: 4.4 g/dL (ref 4.0–5.0)
Alkaline Phosphatase: 100 IU/L (ref 44–121)
BUN/Creatinine Ratio: 16 (ref 9–20)
BUN: 16 mg/dL (ref 6–24)
Basophils Absolute: 0.1 10*3/uL (ref 0.0–0.2)
Basos: 1 %
Bilirubin Total: 0.6 mg/dL (ref 0.0–1.2)
Calcium: 9.6 mg/dL (ref 8.7–10.2)
Chloride: 102 mmol/L (ref 96–106)
Chol/HDL Ratio: 4.9 ratio (ref 0.0–5.0)
Cholesterol, Total: 194 mg/dL (ref 100–199)
Creatinine, Ser: 1.02 mg/dL (ref 0.76–1.27)
EOS (ABSOLUTE): 0.1 10*3/uL (ref 0.0–0.4)
Eos: 1 %
Estimated CHD Risk: 1 times avg. (ref 0.0–1.0)
Free Thyroxine Index: 2.4 (ref 1.2–4.9)
GFR calc Af Amer: 102 mL/min/{1.73_m2} (ref >59)
GFR calc non Af Amer: 88 mL/min/{1.73_m2} (ref >59)
GGT: 89 IU/L — ABNORMAL HIGH (ref 0–65)
Globulin, Total: 2.9 g/dL (ref 1.5–4.5)
Glucose: 103 mg/dL — ABNORMAL HIGH (ref 65–99)
HDL: 40 mg/dL (ref >39)
Hematocrit: 47.6 % (ref 37.5–51.0)
Hemoglobin: 16.3 g/dL (ref 13.0–17.7)
Immature Grans (Abs): 0 10*3/uL (ref 0.0–0.1)
Immature Granulocytes: 0 %
Iron: 87 ug/dL (ref 38–169)
LDH: 163 IU/L (ref 121–224)
LDL Chol Calc (NIH): 128 mg/dL — ABNORMAL HIGH (ref 0–99)
Lymphocytes Absolute: 1.7 10*3/uL (ref 0.7–3.1)
Lymphs: 26 %
MCH: 31.4 pg (ref 26.6–33.0)
MCHC: 34.2 g/dL (ref 31.5–35.7)
MCV: 92 fL (ref 79–97)
Monocytes Absolute: 0.6 10*3/uL (ref 0.1–0.9)
Monocytes: 9 %
Neutrophils Absolute: 4 10*3/uL (ref 1.4–7.0)
Neutrophils: 63 %
Phosphorus: 3.4 mg/dL (ref 2.8–4.1)
Platelets: 206 10*3/uL (ref 150–450)
Potassium: 4.3 mmol/L (ref 3.5–5.2)
Prostate Specific Ag, Serum: 0.5 ng/mL (ref 0.0–4.0)
RBC: 5.19 x10E6/uL (ref 4.14–5.80)
RDW: 12.2 % (ref 11.6–15.4)
Sodium: 141 mmol/L (ref 134–144)
T3 Uptake Ratio: 33 % (ref 24–39)
T4, Total: 7.4 ug/dL (ref 4.5–12.0)
TSH: 1.54 u[IU]/mL (ref 0.450–4.500)
Total Protein: 7.3 g/dL (ref 6.0–8.5)
Triglycerides: 145 mg/dL (ref 0–149)
Uric Acid: 7.2 mg/dL (ref 3.8–8.4)
VLDL Cholesterol Cal: 26 mg/dL (ref 5–40)
WBC: 6.5 10*3/uL (ref 3.4–10.8)

## 2021-01-08 ENCOUNTER — Encounter: Payer: 59 | Admitting: Adult Medicine

## 2021-01-11 ENCOUNTER — Other Ambulatory Visit: Payer: Self-pay

## 2021-01-11 ENCOUNTER — Encounter: Payer: Self-pay | Admitting: Adult Medicine

## 2021-01-11 ENCOUNTER — Ambulatory Visit: Payer: Self-pay | Admitting: Adult Medicine

## 2021-01-11 VITALS — BP 130/90 | HR 87 | Temp 97.5°F | Resp 14 | Ht 76.0 in | Wt 246.0 lb

## 2021-01-11 DIAGNOSIS — E7849 Other hyperlipidemia: Secondary | ICD-10-CM

## 2021-01-11 MED ORDER — COENZYME Q10 30 MG PO CAPS: 100.0000 mg | ORAL_CAPSULE | Freq: Two times a day (BID) | ORAL | 6 refills | Status: AC

## 2021-01-11 MED ORDER — VITAMIN D (ERGOCALCIFEROL) 1.25 MG (50000 UNIT) PO CAPS: 50000.0000 [IU] | ORAL_CAPSULE | ORAL | 0 refills | Status: AC

## 2021-01-11 MED ORDER — MAGNESIUM GLUCONATE 500 (27 MG) MG PO TABS: 1000.0000 mg | ORAL_TABLET | Freq: Two times a day (BID) | ORAL | 1 refills | Status: AC

## 2021-01-11 NOTE — Progress Notes (Signed)
HISTORY  Chief Complaint Annual Exam (Pt presents today to complete physical and re-evaluate BP. CL,RMA)   HPI Brandon Kim is a 45 y.o. male reports for annual exam. Reports no clinical problem except male performance has declined but requests no meds at this time       Past Medical History:  Diagnosis Date  . Asthma   . Elevated BP without diagnosis of hypertension   . Hyperlipidemia   . Overweight   . Sedentary lifestyle   . Tobacco dependency     Patient Active Problem List   Diagnosis Date Noted  . Elevated BP without diagnosis of hypertension 08/24/2019  . Abrasion, elbow w/o infection 08/24/2019  . Eye irritation 08/24/2019    Past Surgical History:  Procedure Laterality Date  . TONSILLECTOMY      Prior to Admission medications   Medication Sig Start Date End Date Taking? Authorizing Provider  atorvastatin (LIPITOR) 40 MG tablet Take 1 tablet (40 mg total) by mouth daily. 11/06/20  Yes Joni Reining, PA-C  triamterene-hydrochlorothiazide (MAXZIDE-25) 37.5-25 MG tablet Take 1 tablet by mouth daily. 12/06/20  Yes Joni Reining, PA-C  cetirizine (ZYRTEC) 10 MG tablet Take 1 tablet (10 mg total) by mouth 2 (two) times daily for 3 days. 08/10/19 08/24/19  Rae Halsted, PA-C    Allergies Patient has no known allergies.  Family History  Problem Relation Age of Onset  . Heart attack Mother   . Heart disease Mother   . COPD Mother   . Breast cancer Mother     Social History Social History   Tobacco Use  . Smoking status: Current Every Day Smoker    Packs/day: 1.00    Years: 20.00    Pack years: 20.00    Types: Cigarettes  . Smokeless tobacco: Never Used  Vaping Use  . Vaping Use: Never used  Substance Use Topics  . Alcohol use: Yes    Comment: Weekends  . Drug use: No    Review of Systems Constitutional: No fever/chills Eyes: No visual changes. ENT: No sore throat. Cardiovascular: Denies chest pain. Respiratory: Denies shortness of  breath. Gastrointestinal: No abdominal pain.  No nausea, no vomiting.  No diarrhea.  No constipation. Genitourinary: Negative for dysuria. Musculoskeletal: Negative for back pain. Skin: Negative for rash. Neurological: Negative for headaches, focal weakness or numbness. Psychiatric: stable mood ________________________________  PHYSICAL EXAM:  VITAL SIGNS: 130/90 Constitutional: Alert and oriented. Well appearing and in no acute distress. Eyes: Conjunctivae are normal. PERRL. EOMI. Head: Atraumatic. Nose: No congestion/rhinnorhea. Mouth/Throat: Mucous membranes are moist.  Oropharynx non-erythematous. Neck: No stridor. No cervical spine tenderness to palpation  No cervical lymphadenopathy. Cardiovascular: Normal rate, regular rhythm. Grossly normal heart sounds.  Good peripheral circulation. Respiratory: Normal respiratory effort.  No retractions. Lungs CTAB. Gastrointestinal: Soft and nontender. No distention. No abdominal bruits. No CVA tenderness. Genitourinary: 2 scrotal nl size teste no hernia, guiac neg prostrate nl size for age without nodularity tenderness or assym Musculoskeletal: No lower extremity tenderness nor edema.  No joint effusions. Neurologic:  Normal speech and language. No gross focal neurologic deficits are appreciated. No gait instability. Skin:  Skin is warm, dry and intact. No rash noted. Psychiatric: Mood and affect are normal. Speech and behavior are normal. _________________________________  LABS Component Ref Range & Units 8 d ago 8 mo ago 1 yr ago 3 yr ago 5 yr ago  Glucose 65 - 99 mg/dL 700FVCB  92        AST  0 - 40 IU/L 33  18      ALT 0 - 44 IU/L 58High  31      GGT 0 - 65 IU/L 89High  74High      Iron 38 - 169 ug/dL 87  175      Cholesterol, Total 100 - 199 mg/dL 102  585IDPO      Triglycerides 0 - 149 mg/dL 242  353IRWE  315QMGQQPYP R  160 R  357Abnormal R   HDL >39 mg/dL 40  42  95KDTOIZTI R  38 R  30Abnormal R   VLDL  Cholesterol Cal 5 - 40 mg/dL 26  45YKDX      LDL Chol Calc (NIH) 0 - 99 mg/dL 833ASNK  539JQBH      Chol/HDL Ratio 0.0 - 5.0 ratio 4.9  5.3High CM       nl  EKG  2021 reviewed  nl except r/l atrial enlargement   _________________________________  INITIAL IMPRESSION / ASSESSMENT AND PLAN  Borderline hypertension   NL axis morphology, intervals bi atrial enlargement (active atrial stretch receptor suggesting ANF activation      as part of bp) Hyperlipidemia, controlled Elevated lfts reduce etoh recheck currently take liptor Add coenzymeQ10 Male infertility Discussed elevated LFTs states test could be related to excessive drinking while on vacation.  Drinks on weekends suggest reduce intake add bcomplex vitamin Client will work to reduce high fat, meat intake    Will add magnesium to bp regimen  ________________________________

## 2021-02-08 ENCOUNTER — Other Ambulatory Visit: Payer: Self-pay

## 2021-02-08 ENCOUNTER — Ambulatory Visit: Payer: Self-pay

## 2021-02-08 VITALS — BP 138/78 | HR 82

## 2021-02-08 DIAGNOSIS — I10 Essential (primary) hypertension: Secondary | ICD-10-CM

## 2021-02-12 NOTE — Progress Notes (Signed)
02/08/21 Brandon Kim came in to assess BP and compare against his radial cuff.

## 2021-03-22 DIAGNOSIS — Z20822 Contact with and (suspected) exposure to covid-19: Secondary | ICD-10-CM | POA: Diagnosis not present

## 2021-03-23 DIAGNOSIS — Z20822 Contact with and (suspected) exposure to covid-19: Secondary | ICD-10-CM | POA: Diagnosis not present

## 2021-05-28 ENCOUNTER — Ambulatory Visit: Payer: Self-pay | Admitting: Adult Health

## 2021-05-28 ENCOUNTER — Telehealth: Payer: Self-pay

## 2021-05-28 VITALS — Temp 98.9°F

## 2021-05-28 DIAGNOSIS — B349 Viral infection, unspecified: Secondary | ICD-10-CM

## 2021-05-28 DIAGNOSIS — R509 Fever, unspecified: Secondary | ICD-10-CM

## 2021-05-28 LAB — POCT INFLUENZA A/B
Influenza A, POC: NEGATIVE
Influenza B, POC: NEGATIVE

## 2021-05-28 NOTE — Progress Notes (Signed)
[facility] [address], [place] [address]   Office Visit Note  Patient Name: Brandon Kim  [id number]  [id number]  Date of Service: 05/28/2021  Chief Complaint  Patient presents with   covid screening     HPI Pt is on telephone for virtual visit.  He reports traveling to [place] recently, and has been having bodyaches/chills, fever, headache since one day after returning.  He has taken motrin, and that seems to have broken his fever both times.  T max  102.2.  He has not had any medication in the last 8 hours. He is flu negative on our curbside swab.  We did a PCR for covid that is sent to lab.  He had 3 negative antigen tests at home.       Current Medication:  Outpatient Encounter Medications as of 05/28/2021  Medication Sig   atorvastatin (LIPITOR) 40 MG tablet Take 1 tablet (40 mg total) by mouth daily.   cetirizine (ZYRTEC) 10 MG tablet Take 1 tablet (10 mg total) by mouth 2 (two) times daily for 3 days.   triamterene-hydrochlorothiazide (MAXZIDE-25) 37.5-25 MG tablet Take 1 tablet by mouth daily.   No facility-administered encounter medications on file as of 05/28/2021.      Medical History: Past Medical History:  Diagnosis Date   Asthma    Elevated BP without diagnosis of hypertension    Hyperlipidemia    Overweight    Sedentary lifestyle    Tobacco dependency      Vital Signs: Temp 98.9 F (37.2 C) (Oral)    Review of Systems  Constitutional:  Positive for chills, fatigue and fever. Negative for activity change and appetite change.  HENT:  Positive for sinus pressure. Negative for congestion, postnasal drip, sinus pain, sore throat, trouble swallowing and voice change.   Eyes:  Negative for pain, discharge and visual disturbance.  Respiratory:  Negative for cough, chest tightness and shortness of breath.   Cardiovascular:  Negative for chest pain and leg swelling.  Gastrointestinal:  Negative for abdominal distention,  abdominal pain, constipation and diarrhea.  Musculoskeletal:  Negative for arthralgias, back pain and neck pain.  Skin:  Negative for color change.  Neurological:  Negative for dizziness, weakness and headaches.  Hematological:  Negative for adenopathy.  Psychiatric/Behavioral:  Negative for agitation, confusion and suicidal ideas.    Physical Exam Unable to examine patient as he was a curbside swab, and virtual visit via telephone.    Assessment/Plan: 1. Viral illness Rest, stay hydrated.  Use OTC meds for symptom management.  Follow up via telephone if new or worse symptoms develop.   2. Fever, unspecified  - POCT Influenza A/B (POC[facility]) - Novel Coronavirus, NAA ([facility])     General Counseling: carlson belland understanding of the findings of todays visit and agrees with plan of treatment. I have discussed any further diagnostic evaluation that may be needed or ordered today. We also reviewed his medications today. he has been encouraged to call the office with any questions or concerns that should arise related to todays visit.   Orders Placed This Encounter  Procedures   Novel Coronavirus, NAA ([facility])   POCT Influenza A/B (POC66)    No orders of the defined types were placed in this encounter.   Time spent:20 Minutes    Johnna Acosta AGNP-C Nurse Practitioner

## 2021-05-28 NOTE — Progress Notes (Signed)
Pt presents today to have PCR COVID AND FLU SCREENING due to fever and body aches.05/26/21. Pt states he's had 3 negative rapid PCR home tests.  Flu resulted:negative

## 2021-05-28 NOTE — Telephone Encounter (Signed)
Brandon Kim called to report he has experienced fevers through weekend ranging 101 to 102F.  Mild congestion notes, headaches and body aches. 3 Rapid negatives including today. He is agreeable to PCR testing, Possible Flu test and Virtual  visit with provider today

## 2021-05-30 ENCOUNTER — Telehealth: Payer: Self-pay

## 2021-05-30 LAB — NOVEL CORONAVIRUS, NAA: SARS-CoV-2, NAA: DETECTED — AB

## 2021-05-30 LAB — SARS-COV-2, NAA 2 DAY TAT

## 2021-05-30 NOTE — Telephone Encounter (Signed)
COVID-19 resulted positive. Pt aware and expressed understanding. CL,RMA

## 2021-08-05 ENCOUNTER — Other Ambulatory Visit: Payer: Self-pay | Admitting: Physician Assistant

## 2021-08-05 DIAGNOSIS — E7849 Other hyperlipidemia: Secondary | ICD-10-CM

## 2021-08-13 ENCOUNTER — Other Ambulatory Visit: Payer: Self-pay

## 2021-08-13 NOTE — Telephone Encounter (Signed)
Brandon Kim emailed me stating his pharmacy was messaging him that they were waiting for authorization from his doctor to fell his Rx for Atovastatin.  We haven't received anything from the pharmacy.  Went to put a refill request in Epic & a box opened up where the pharmacy sent an electronic refill request to Brandon Parcel, PA-C to [facility] ED.  Re-routed the Rx refill request to Brandon Parcel, PA-C.  AMD

## 2021-12-01 ENCOUNTER — Other Ambulatory Visit: Payer: Self-pay | Admitting: Physician Assistant

## 2021-12-01 DIAGNOSIS — I10 Essential (primary) hypertension: Secondary | ICD-10-CM

## 2021-12-05 ENCOUNTER — Other Ambulatory Visit: Payer: Self-pay

## 2021-12-05 NOTE — Telephone Encounter (Signed)
Brandon Kim send an email requesting Rx refill for his "fluid pill".  States his pharmacy told him they were still waiting for a response from doctor's office.  Went to Rx refill & there was a pended Rx refill request for Maxzide-25 37.5-25 mg sent electronically to [facility] by patient's pharmacy.  Re-routed the pended refill request for Maxzide to Durward Parcel, PA-C.  AMD

## 2022-01-16 DIAGNOSIS — H5213 Myopia, bilateral: Secondary | ICD-10-CM | POA: Diagnosis not present

## 2022-01-22 ENCOUNTER — Ambulatory Visit: Payer: Self-pay

## 2022-01-22 ENCOUNTER — Other Ambulatory Visit: Payer: Self-pay

## 2022-01-22 DIAGNOSIS — Z0000 Encounter for general adult medical examination without abnormal findings: Secondary | ICD-10-CM

## 2022-01-22 LAB — POCT URINALYSIS DIPSTICK
Bilirubin, UA: NEGATIVE
Blood, UA: NEGATIVE
Glucose, UA: NEGATIVE
Ketones, UA: POSITIVE
Nitrite, UA: NEGATIVE
Protein, UA: POSITIVE — AB
Spec Grav, UA: 1.025 (ref 1.010–1.025)
Urobilinogen, UA: 0.2 E.U./dL
pH, UA: 6 (ref 5.0–8.0)

## 2022-01-22 NOTE — Progress Notes (Signed)
01/31/22 annual physical scheduled./CL,RMA °

## 2022-01-23 LAB — CMP12+LP+TP+TSH+6AC+PSA+CBC…
ALT: 35 IU/L (ref 0–44)
AST: 22 IU/L (ref 0–40)
Albumin/Globulin Ratio: 1.9 (ref 1.2–2.2)
Albumin: 4.7 g/dL (ref 4.0–5.0)
Alkaline Phosphatase: 83 IU/L (ref 44–121)
BUN/Creatinine Ratio: 16 (ref 9–20)
BUN: 15 mg/dL (ref 6–24)
Basophils Absolute: 0.1 10*3/uL (ref 0.0–0.2)
Basos: 1 %
Bilirubin Total: 1 mg/dL (ref 0.0–1.2)
Calcium: 10 mg/dL (ref 8.7–10.2)
Chloride: 102 mmol/L (ref 96–106)
Chol/HDL Ratio: 6.4 ratio — ABNORMAL HIGH (ref 0.0–5.0)
Cholesterol, Total: 216 mg/dL — ABNORMAL HIGH (ref 100–199)
Creatinine, Ser: 0.94 mg/dL (ref 0.76–1.27)
EOS (ABSOLUTE): 0.1 10*3/uL (ref 0.0–0.4)
Eos: 2 %
Estimated CHD Risk: 1.3 times avg. — ABNORMAL HIGH (ref 0.0–1.0)
Free Thyroxine Index: 2.4 (ref 1.2–4.9)
GGT: 60 IU/L (ref 0–65)
Globulin, Total: 2.5 g/dL (ref 1.5–4.5)
Glucose: 101 mg/dL — ABNORMAL HIGH (ref 70–99)
HDL: 34 mg/dL — ABNORMAL LOW (ref >39)
Hematocrit: 47.5 % (ref 37.5–51.0)
Hemoglobin: 16.7 g/dL (ref 13.0–17.7)
Immature Grans (Abs): 0 10*3/uL (ref 0.0–0.1)
Immature Granulocytes: 0 %
Iron: 98 ug/dL (ref 38–169)
LDH: 151 IU/L (ref 121–224)
LDL Chol Calc (NIH): 146 mg/dL — ABNORMAL HIGH (ref 0–99)
Lymphocytes Absolute: 2 10*3/uL (ref 0.7–3.1)
Lymphs: 27 %
MCH: 31.7 pg (ref 26.6–33.0)
MCHC: 35.2 g/dL (ref 31.5–35.7)
MCV: 90 fL (ref 79–97)
Monocytes Absolute: 0.6 10*3/uL (ref 0.1–0.9)
Monocytes: 8 %
Neutrophils Absolute: 4.5 10*3/uL (ref 1.4–7.0)
Neutrophils: 62 %
Phosphorus: 3.6 mg/dL (ref 2.8–4.1)
Platelets: 210 10*3/uL (ref 150–450)
Potassium: 4.1 mmol/L (ref 3.5–5.2)
Prostate Specific Ag, Serum: 0.5 ng/mL (ref 0.0–4.0)
RBC: 5.26 x10E6/uL (ref 4.14–5.80)
RDW: 12.2 % (ref 11.6–15.4)
Sodium: 140 mmol/L (ref 134–144)
T3 Uptake Ratio: 28 % (ref 24–39)
T4, Total: 8.4 ug/dL (ref 4.5–12.0)
TSH: 1.46 u[IU]/mL (ref 0.450–4.500)
Total Protein: 7.2 g/dL (ref 6.0–8.5)
Triglycerides: 199 mg/dL — ABNORMAL HIGH (ref 0–149)
Uric Acid: 7.6 mg/dL (ref 3.8–8.4)
VLDL Cholesterol Cal: 36 mg/dL (ref 5–40)
WBC: 7.3 10*3/uL (ref 3.4–10.8)
eGFR: 101 mL/min/{1.73_m2} (ref >59)

## 2022-01-30 ENCOUNTER — Other Ambulatory Visit: Payer: Self-pay

## 2022-01-30 ENCOUNTER — Other Ambulatory Visit: Payer: 59 | Admitting: Physician Assistant

## 2022-01-30 DIAGNOSIS — J020 Streptococcal pharyngitis: Secondary | ICD-10-CM

## 2022-01-30 LAB — POCT RAPID STREP A (OFFICE): Rapid Strep A Screen: NEGATIVE

## 2022-01-30 NOTE — Progress Notes (Signed)
Pt denies any congestion or post nasal drainage. Pt complains of Sore thoat, with no fever for 2 days.

## 2022-01-31 ENCOUNTER — Ambulatory Visit: Payer: Self-pay | Admitting: Physician Assistant

## 2022-01-31 ENCOUNTER — Encounter: Payer: Self-pay | Admitting: Physician Assistant

## 2022-01-31 VITALS — BP 137/88 | HR 90 | Temp 97.8°F | Resp 14 | Wt 253.0 lb

## 2022-01-31 DIAGNOSIS — Z0000 Encounter for general adult medical examination without abnormal findings: Secondary | ICD-10-CM

## 2022-01-31 DIAGNOSIS — E7849 Other hyperlipidemia: Secondary | ICD-10-CM

## 2022-01-31 NOTE — Progress Notes (Signed)
[place] [facility]   ____________________________________________   None    (approximate)  I have reviewed the triage vital signs and the nursing notes.   HISTORY  Chief Complaint Annual Exam    HPI Brandon Kim is a 46 y.o. male patient presents for annual physical exam.  No concerning complaints.  Past medical history for hyperlipidemia and hypertension.  Patient is currently taking Lipitor 40 mg and Maxide 25 mg.         Past Medical History:  Diagnosis Date   Asthma    Elevated BP without diagnosis of hypertension    Hyperlipidemia    Overweight    Sedentary lifestyle    Tobacco dependency     Patient Active Problem List   Diagnosis Date Noted   Elevated BP without diagnosis of hypertension 08/24/2019   Abrasion, elbow w/o infection 08/24/2019   Eye irritation 08/24/2019    Past Surgical History:  Procedure Laterality Date   TONSILLECTOMY      Prior to Admission medications   Medication Sig Start Date End Date Taking? Authorizing Provider  atorvastatin (LIPITOR) 40 MG tablet Take 1 tablet (40 mg total) by mouth daily. 08/13/21  Yes Sable Feil, PA-C  triamterene-hydrochlorothiazide (MAXZIDE-25) 37.5-25 MG tablet Take 1 tablet by mouth daily. 12/05/21  Yes Sable Feil, PA-C    Allergies Patient has no known allergies.  Family History  Problem Relation Age of Onset   Heart attack Mother    Heart disease Mother    COPD Mother    Breast cancer Mother     Social History Social History   Tobacco Use   Smoking status: Every Day    Packs/day: 1.00    Years: 20.00    Pack years: 20.00    Types: Cigarettes   Smokeless tobacco: Never  Vaping Use   Vaping Use: Never used  Substance Use Topics   Alcohol use: Yes    Comment: Weekends   Drug use: No    Review of Systems Constitutional: No fever/chills Eyes: No visual changes. ENT: No sore throat. Cardiovascular: Denies chest pain. Respiratory: Denies shortness of  breath. Gastrointestinal: No abdominal pain.  No nausea, no vomiting.  No diarrhea.  No constipation. Genitourinary: Negative for dysuria. Musculoskeletal: Negative for back pain. Skin: Negative for rash. Neurological: Negative for headaches, focal weakness or numbness. Endocrine: Hyperlipidemia and hypertension ____________________________________________   PHYSICAL EXAM:  VITAL SIGNS: Temperature 97.8, pulse 90, respiration 14, BP is 137/88, and patient 96% O2 sat on room air.  Patient weighs 253 pounds. Constitutional: Alert and oriented. Well appearing and in no acute distress. Eyes: Conjunctivae are normal. PERRL. EOMI. Head: Atraumatic. Nose: No congestion/rhinnorhea. Mouth/Throat: Mucous membranes are moist.  Oropharynx non-erythematous. Neck: No stridor. Pulm No cervical spine tenderness to palpation. Hematological/Lymphatic/Immunilogical: No cervical lymphadenopathy. Cardiovascular: Normal rate, regular rhythm. Grossly normal heart sounds.  Good peripheral circulation. Respiratory: Normal respiratory effort.  No retractions. Lungs CTAB. Gastrointestinal: Soft and nontender. No distention. No abdominal bruits. No CVA tenderness. Genitourinary: Deferred Musculoskeletal: No lower extremity tenderness nor edema.  No joint effusions. Neurologic:  Normal speech and language. No gross focal neurologic deficits are appreciated. No gait instability. Skin:  Skin is warm, dry and intact. No rash noted. Psychiatric: Mood and affect are normal. Speech and behavior are normal.  ____________________________________________   LABS _       Component Ref Range & Units 9 d ago (01/22/22) 1 yr ago (01/03/21) 1 yr ago (05/16/20)  Color, UA  dark  yellow  yellow  yellow   Clarity, UA  clear  clear  clear   Glucose, UA Negative Negative  Negative  Negative   Bilirubin, UA  negative  negative  negative   Ketones, UA  positive  negative  negative   Comment: trace -+  Spec Grav, UA 1.010 -  1.025 1.025  >=1.030 Abnormal   >=1.030 Abnormal    Blood, UA  negative  negative  negative   pH, UA 5.0 - 8.0 6.0  5.5  5.5   Protein, UA Negative Positive Abnormal   Positive Abnormal  CM  Positive Abnormal  CM   Comment: 1+ scant urine  Urobilinogen, UA 0.2 or 1.0 E.U./dL 0.2  0.2  0.2   Nitrite, UA  negative  negative  negative   Leukocytes, UA Negative Trace Abnormal   Negative  Negative   Appearance  dark  cloudy     Odor               Specimen Collected: 01/22/22 08:56 Last Resulted: 01/22/22 08:56      Lab Flowsheet    Order Details    View Encounter    Lab and Collection Details    Routing    Result History    View Encounter Conversation      CM=Additional comments      Result Care Coordination   Patient Communication   Add Comments   Seen Back to Top       Other Results from 01/22/2022   Contains abnormal data CMP12+LP+TP+TSH+6AC+PSA+CBC Order: [id number] Status: Final result    Visible to patient: Yes (seen)    Next appt: 05/02/2022 at 08:15 AM in [facility] ([facility] NURSE)    Dx: Routine adult health maintenance    0 Result Notes          Component Ref Range & Units 9 d ago (01/22/22) 1 yr ago (01/03/21) 1 yr ago (05/16/20) 2 yr ago (02/16/19) 4 yr ago (11/26/17) 6 yr ago (10/24/15)  Glucose 70 - 99 mg/dL 101 High   103 High  R  92 R      Uric Acid 3.8 - 8.4 mg/dL 7.6  7.2 CM  7.6 CM      Comment:            Therapeutic target for gout patients: <6.0  BUN 6 - 24 mg/dL '15  16  15      ' Creatinine, Ser 0.76 - 1.27 mg/dL 0.94  1.02  0.90      eGFR >59 mL/min/1.73 101        BUN/Creatinine Ratio 9 - '20 16  16  17      ' Sodium 134 - 144 mmol/L 140  141  142      Potassium 3.5 - 5.2 mmol/L 4.1  4.3  4.2      Chloride 96 - 106 mmol/L 102  102  106      Calcium 8.7 - 10.2 mg/dL 10.0  9.6  9.2      Phosphorus 2.8 - 4.1 mg/dL 3.6  3.4  3.1      Total Protein 6.0 - 8.5 g/dL 7.2  7.3  6.6      Albumin 4.0 - 5.0 g/dL 4.7  4.4  4.3      Globulin, Total 1.5 - 4.5 g/dL  2.5  2.9  2.3      Albumin/Globulin Ratio 1.2 - 2.2 1.9  1.5  1.9      Bilirubin Total  0.0 - 1.2 mg/dL 1.0  0.6  0.5      Alkaline Phosphatase 44 - 121 IU/L 83  100  78 R      LDH 121 - 224 IU/L 151  163  162      AST 0 - 40 IU/L 22  33  18      ALT 0 - 44 IU/L 35  58 High   31      GGT 0 - 65 IU/L 60  89 High   74 High       Iron 38 - 169 ug/dL 98  87  122      Cholesterol, Total 100 - 199 mg/dL 216 High   194  221 High       Triglycerides 0 - 149 mg/dL 199 High   145  230 High   178 Abnormal  R  160 R  357 Abnormal  R   HDL >39 mg/dL 34 Low   40  42  31 Abnormal  R  38 R  30 Abnormal  R   VLDL Cholesterol Cal 5 - 40 mg/dL 36  26  41 High       LDL Chol Calc (NIH) 0 - 99 mg/dL 146 High   128 High   138 High       Chol/HDL Ratio 0.0 - 5.0 ratio 6.4 High   4.9 CM  5.3 High  CM      Comment:                                   T. Chol/HDL Ratio                                              Men  Women                                1/2 Avg.Risk  3.4    3.3                                    Avg.Risk  5.0    4.4                                 2X Avg.Risk  9.6    7.1                                 3X Avg.Risk 23.4   11.0   Estimated CHD Risk 0.0 - 1.0 times avg. 1.3 High   1.0 CM  1.1 High  CM      Comment: The CHD Risk is based on the T. Chol/HDL ratio. Other  factors affect CHD Risk such as hypertension, smoking,  diabetes, severe obesity, and family history of  premature CHD.   TSH 0.450 - 4.500 uIU/mL 1.460  1.540  1.470      T4, Total 4.5 - 12.0 ug/dL 8.4  7.4  6.1      T3 Uptake Ratio 24 - 39 % 28  33  28  Free Thyroxine Index 1.2 - 4.9 2.4  2.4  1.7      Prostate Specific Ag, Serum 0.0 - 4.0 ng/mL 0.5  0.5 CM  0.4 CM      Comment: Roche ECLIA methodology.  According to the American Urological Association, Serum PSA should  decrease and remain at undetectable levels after radical  prostatectomy. The AUA defines biochemical recurrence as an initial  PSA value 0.2 ng/mL or  greater followed by a subsequent confirmatory  PSA value 0.2 ng/mL or greater.  Values obtained with different assay methods or kits cannot be used  interchangeably. Results cannot be interpreted as absolute evidence  of the presence or absence of malignant disease.   WBC 3.4 - 10.8 x10E3/uL 7.3  6.5  5.4      RBC 4.14 - 5.80 x10E6/uL 5.26  5.19  4.85      Hemoglobin 13.0 - 17.7 g/dL 16.7  16.3  15.2      Hematocrit 37.5 - 51.0 % 47.5  47.6  44.3      MCV 79 - 97 fL 90  92  91      MCH 26.6 - 33.0 pg 31.7  31.4  31.3      MCHC 31.5 - 35.7 g/dL 35.2  34.2  34.3      RDW 11.6 - 15.4 % 12.2  12.2  12.8      Platelets 150 - 450 x10E3/uL 210  206  195      Neutrophils Not Estab. % 62  63  56      Lymphs Not Estab. % '27  26  15      ' Monocytes Not Estab. % '8  9  26      ' Eos Not Estab. % '2  1  2      ' Basos Not Estab. % '1  1  1      ' Neutrophils Absolute 1.4 - 7.0 x10E3/uL 4.5  4.0  3.1      Lymphocytes Absolute 0.7 - 3.1 x10E3/uL 2.0  1.7  0.8      Monocytes Absolute 0.1 - 0.9 x10E3/uL 0.6  0.6  1.4 High       EOS (ABSOLUTE) 0.0 - 0.4 x10E3/uL 0.1  0.1  0.1      Basophils Absolute 0.0 - 0.2 x10E3/uL 0.1  0.1  0.1      Immature Granulocytes Not Estab. % 0  0  0      Immature Grans (            ___________________________________________  EKG  Sinus  Rhythm  WITHIN NORMAL LIMITS at 77 bpm.  ____________________________________________    ____________________________________________   INITIAL IMPRESSION / ASSESSMENT AND PLAN / ED COURSE  As part of my medical decision making, I reviewed the following data within the electronic MEDICAL RECORD NUMBER         Discussed lab and EKG findings with patient.  Discussed continue previous medications and lifestyle changes for hyperlipidemia.  Patient will have repeat fasting labs in 3 months.     ____________________________________________   FINAL CLINICAL IMPRESSION(S)  Well exam ED Discharge Orders     None        Note:   This document was prepared using Dragon voice recognition software and may include unintentional dictation errors.

## 2022-01-31 NOTE — Progress Notes (Signed)
Pt denies any issues or concerns at this time/CL,RMA ?

## 2022-02-06 ENCOUNTER — Other Ambulatory Visit: Payer: Self-pay | Admitting: Physician Assistant

## 2022-02-06 DIAGNOSIS — E7849 Other hyperlipidemia: Secondary | ICD-10-CM

## 2022-02-19 ENCOUNTER — Other Ambulatory Visit: Payer: Self-pay

## 2022-02-19 NOTE — Telephone Encounter (Signed)
Brandon Kim called [facility] requesting Rx refill for Lipitor 40 mg. ? ?Upon entering Rx refill info into Epic, a box opened up for pended Rx refill request for Lipitor sent electronically by pharmacy to ?[facility] ED. ? ?Re-routed the pended Rx refill request for Ambien 40 mg to Durward Parcel, PA-C. ? ?AMD ?

## 2022-03-06 ENCOUNTER — Other Ambulatory Visit: Payer: Self-pay

## 2022-03-06 ENCOUNTER — Encounter: Payer: Self-pay | Admitting: Physician Assistant

## 2022-03-06 ENCOUNTER — Ambulatory Visit: Payer: Self-pay | Admitting: Physician Assistant

## 2022-03-06 VITALS — BP 155/109 | HR 73 | Temp 98.0°F | Resp 16 | Ht 76.0 in | Wt 252.0 lb

## 2022-03-06 DIAGNOSIS — K219 Gastro-esophageal reflux disease without esophagitis: Secondary | ICD-10-CM

## 2022-03-06 MED ORDER — PANTOPRAZOLE SODIUM 40 MG PO TBEC: 40.0000 mg | DELAYED_RELEASE_TABLET | Freq: Every day | ORAL | 3 refills | Status: DC

## 2022-03-06 NOTE — Progress Notes (Signed)
Pt presents today for possible acid reflux. ?

## 2022-03-06 NOTE — Progress Notes (Signed)
? ?  Subjective: Epigastric pain  ? ? Patient ID: Brandon Kim, male    DOB: 1976-12-03, 46 y.o.   MRN: [id number] ? ?HPI ?Patient complains of epigastric pain which is worse at night.  Patient states last meal was around 7 PM he normally retires around 9 PM.  Patient is appropriate for spicy foods.  Patient states when laying down he had to sit up secondary to the burning sensation.  Patient denies nausea or vomiting.  Patient denies diarrhea or constipation.  Patient denies dyspnea or chest pain.  Patient has a history of elevated blood pressure without diagnosis of hypertension. ? ? ?Review of Systems ?Negative except for chief complaint ?   ?Objective:  ? Physical Exam ? ?Temperature is 98, pulse 73, respirations 16, BP is 159/109, patient 97% O2 sat on room air.  Patient weighs 252 pounds and BMI is 30.67. ?HEENT is unremarkable.  Neck is supple adenopathy or bruits.  Lungs clear to auscultation.  Heart regular rate and rhythm.  Abdomen negative HSM, no active bowel sounds, soft, nontender to palpation. ? ?   ?Assessment & Plan: Gastric reflux  ?Patient is advised to modify lifestyles of eating earlier before returning to bed.  Patient also advised to elevate his head with extra pillows or raising the bed.  Patient given a prescription from Protonix.  Patient advised to follow-up for 3-day blood pressure check.  Patient advised to follow-up in 4 to 6 weeks after starting Protonix. ? ?

## 2022-03-15 ENCOUNTER — Ambulatory Visit: Payer: Self-pay

## 2022-03-15 VITALS — BP 156/102

## 2022-03-15 DIAGNOSIS — Z0130 Encounter for examination of blood pressure without abnormal findings: Secondary | ICD-10-CM

## 2022-03-15 NOTE — Progress Notes (Signed)
1st BP checked with automatic BP cuff. ? ?BP rechecked after 15 minute wait. ?Manual BP = 140/100 ? ?States he is taking "fluid pill" in the mornings.  He came in today because felt dizzy sitting at his desk, but it went away. ? ?York Spaniel he take his BP med in the mornings.  BP has been elevated & was the last time he was in the clinic (03/06/2022).  Today's BP reading about the same as that day.  Said sometimes he checks his BP in the afternoons & the readings are 130s/85. ? ?Scheduled appt for Monday (03/18/22) with Durward Parcel, PA-C to re-evaluate BP medication. ? ?AMD ?

## 2022-03-18 ENCOUNTER — Encounter: Payer: Self-pay | Admitting: Physician Assistant

## 2022-03-18 ENCOUNTER — Ambulatory Visit: Payer: Self-pay | Admitting: Physician Assistant

## 2022-03-18 VITALS — BP 156/98 | HR 72 | Temp 97.7°F | Resp 16 | Ht 76.0 in | Wt 258.0 lb

## 2022-03-18 DIAGNOSIS — I10 Essential (primary) hypertension: Secondary | ICD-10-CM

## 2022-03-18 NOTE — Progress Notes (Signed)
? ?  Subjective: Elevated blood pressure  ? ? Patient ID: Brandon Kim, male    DOB: [birth year], 46 y.o.   MRN: [id number] ? ?HPI ?Patient has noticed elevated blood pressure for the past week.  There was 1 episode of vertigo.  Patient currently taking Maxide 25/37. ?Review of Systems ?Hypertension ?   ?Objective:  ? Physical Exam ?Temperature 97.7, pulse 72, respirations 16, BP is 156/98, patient weighs 258 pounds and BMI is 31.40. ?HEENT is unremarkable.  Neck is supple without lymphadenopathy or bruits.  Lungs are clear to auscultation.  Heart is regular rate and rhythm. ? ? ?   ?Assessment & Plan: Hypertension  ? ?Patient is amenable to a change in therapy.  Patient will be started on Norvasc 5 mg and follow-up in 1 week and will titrate as necessary.  Discussed side effects of medication. ?

## 2022-03-20 ENCOUNTER — Other Ambulatory Visit: Payer: Self-pay

## 2022-03-20 ENCOUNTER — Other Ambulatory Visit: Payer: Self-pay | Admitting: Physician Assistant

## 2022-03-20 DIAGNOSIS — J020 Streptococcal pharyngitis: Secondary | ICD-10-CM

## 2022-03-20 DIAGNOSIS — Z1152 Encounter for screening for COVID-19: Secondary | ICD-10-CM

## 2022-03-20 LAB — POCT RAPID STREP A (OFFICE): Rapid Strep A Screen: NEGATIVE

## 2022-03-20 MED ORDER — AMLODIPINE BESYLATE 5 MG PO TABS: 5.0000 mg | ORAL_TABLET | Freq: Every day | ORAL | 0 refills | Status: DC

## 2022-03-20 NOTE — Progress Notes (Signed)
Pt presents today with sore throat. ?

## 2022-03-21 LAB — NOVEL CORONAVIRUS, NAA: SARS-CoV-2, NAA: NOT DETECTED

## 2022-04-01 ENCOUNTER — Ambulatory Visit: Payer: Self-pay | Admitting: Physician Assistant

## 2022-04-09 ENCOUNTER — Other Ambulatory Visit: Payer: Self-pay

## 2022-04-09 NOTE — Progress Notes (Signed)
Pt completed random UDS &ETOH. ?Pending results for UDS with [facility].  ? ?

## 2022-04-17 ENCOUNTER — Ambulatory Visit: Payer: Self-pay

## 2022-04-25 ENCOUNTER — Ambulatory Visit: Payer: Self-pay | Admitting: Physician Assistant

## 2022-04-25 ENCOUNTER — Encounter: Payer: Self-pay | Admitting: Physician Assistant

## 2022-04-25 VITALS — BP 130/96 | HR 70 | Temp 98.0°F | Resp 16 | Wt 257.0 lb

## 2022-04-25 DIAGNOSIS — K219 Gastro-esophageal reflux disease without esophagitis: Secondary | ICD-10-CM

## 2022-04-25 DIAGNOSIS — I10 Essential (primary) hypertension: Secondary | ICD-10-CM

## 2022-04-25 NOTE — Progress Notes (Signed)
? ?  Subjective: GERD and hypertension reevaluation.  ? ? Patient ID: Brandon Kim, male    DOB: January 08, 1976, 46 y.o.   MRN: [id number] ? ?HPI ?Patient presents for reevaluation of GERD and hypertension.  Patient was started on 40 mg of Protonix on 03/06/2022.  Patient states after 2 weeks he felt remarkably well and continue medications and went on a vacation to [place].  Patient stated upon return he started experiencing intermitting symptoms again of epigastric pain and sore throat.  Patient states most remarkable reduction of his elevated blood pressure started Norvasc at 5 mg 1 month ago.  Patient admits to multiple high caffeine and tobacco usage. ? ? ?Review of Systems ?GERD and hypertension ?   ?Objective:  ? Physical Exam ?BP 130/96, pulse 70, respirations 16, temperature 98, and patient is 98% O2 sat on room air.  Patient weighs 2 and 57 pounds and BMI is 31.28. ? ?HEENT was unremarkable.  Neck was supple for lymphadenopathy or bruits.  Lungs are clear to auscultation.  Heart regular rate and rhythm.  Abdomen is negative HSM, normoactive bowel sounds, soft and nontender to palpation. ? ?   ?Assessment & Plan: GERD and hypertension  ?Patient will increase Protonix to 40 mg twice daily and follow-up in 1 month.  Advised to decrease caffeine and smoking. ?

## 2022-04-25 NOTE — Progress Notes (Signed)
Norvasc 5 mg started at 03/18/22 visit ? ?AMD ?

## 2022-05-01 ENCOUNTER — Telehealth: Payer: Self-pay

## 2022-05-01 NOTE — Telephone Encounter (Signed)
PA for Protonix submitted through covermymeds for [employer] [facility]. ?Below is the approval letter. ? ? ?Date: 05/01/2022 ?Brandon Kim ?[address] ?[place], [place] [address] ?RE: [name] approved your request for coverage of Esomeprazole Magnesium 40MG  OR ?CPDR. ?Dear [place]: ?We?re pleased to let you know that we?ve approved your or your doctor?s request for coverage ?for Esomeprazole Magnesium 40MG  OR CPDR. You can now fill your prescription, and it will ?be covered according to your plan. ?As long as you remain covered by your prescription drug plan and there are no changes to your ?plan benefits, this request is approved from 05/01/2022 to 04/30/2025. When this approval ?expires, please speak to your doctor about your treatment. ?Sincerely, ?[facility]? ?cc: Dr. 05/03/2022 ?If you have questions, we want to help. ?Call the number on your prescription ID card or in your plan materials to speak with a ?representative. ? ?AMD ?

## 2022-05-02 ENCOUNTER — Other Ambulatory Visit: Payer: Self-pay

## 2022-05-09 ENCOUNTER — Other Ambulatory Visit: Payer: Self-pay

## 2022-05-09 DIAGNOSIS — J3489 Other specified disorders of nose and nasal sinuses: Secondary | ICD-10-CM

## 2022-05-09 DIAGNOSIS — R0981 Nasal congestion: Secondary | ICD-10-CM

## 2022-05-09 DIAGNOSIS — R0989 Other specified symptoms and signs involving the circulatory and respiratory systems: Secondary | ICD-10-CM

## 2022-05-09 LAB — POC COVID19 BINAXNOW: SARS Coronavirus 2 Ag: NEGATIVE

## 2022-05-09 NOTE — Progress Notes (Signed)
S/Sx x 6 days: Sinus Pain & Pressure Nasal & Chest Congestion Productive Cough Denies fever and sore throat  States no OTC meds - said he was getting some Zyrtec  Rapid Covid Test = Negative PCR Covid Rest = Results Pending ([facility])   AMD

## 2022-05-10 LAB — NOVEL CORONAVIRUS, NAA: SARS-CoV-2, NAA: NOT DETECTED

## 2022-05-23 ENCOUNTER — Other Ambulatory Visit: Payer: Self-pay

## 2022-05-23 DIAGNOSIS — K219 Gastro-esophageal reflux disease without esophagitis: Secondary | ICD-10-CM

## 2022-05-23 MED ORDER — PANTOPRAZOLE SODIUM 40 MG PO TBEC: 40.0000 mg | DELAYED_RELEASE_TABLET | Freq: Every day | ORAL | 3 refills | Status: DC

## 2022-05-28 ENCOUNTER — Other Ambulatory Visit: Payer: Self-pay

## 2022-05-28 DIAGNOSIS — E7849 Other hyperlipidemia: Secondary | ICD-10-CM

## 2022-06-16 ENCOUNTER — Other Ambulatory Visit: Payer: Self-pay | Admitting: Physician Assistant

## 2022-06-16 DIAGNOSIS — I10 Essential (primary) hypertension: Secondary | ICD-10-CM

## 2022-06-20 ENCOUNTER — Telehealth: Payer: Self-pay

## 2022-06-20 NOTE — Telephone Encounter (Signed)
Brandon Kim sent an email requesting Rx refill for Amlodipine.  Upon entering Rx refill info into Epic, a pended Rx refill request for Amlodipine was sent to [facility] ED electronically by patient's pharmacy.  Re-routed the Rx refill request for Amlodipine to Anette Riedel, PA-C.  AMD

## 2022-06-20 NOTE — Telephone Encounter (Signed)
Please come by the office for a repeat Blood Pressure check in the near future - call for convenient walk-in time Thank you.  [facility] PAC

## 2022-07-23 DIAGNOSIS — M9903 Segmental and somatic dysfunction of lumbar region: Secondary | ICD-10-CM | POA: Diagnosis not present

## 2022-07-23 DIAGNOSIS — M5416 Radiculopathy, lumbar region: Secondary | ICD-10-CM | POA: Diagnosis not present

## 2022-07-23 DIAGNOSIS — M62830 Muscle spasm of back: Secondary | ICD-10-CM | POA: Diagnosis not present

## 2022-07-23 DIAGNOSIS — M9904 Segmental and somatic dysfunction of sacral region: Secondary | ICD-10-CM | POA: Diagnosis not present

## 2022-08-06 ENCOUNTER — Other Ambulatory Visit: Payer: Self-pay

## 2022-08-06 NOTE — Progress Notes (Signed)
Pt completed random UDS and ETOH.  UDS pending results with [facility].  ETOH: CLEARED.

## 2022-09-16 ENCOUNTER — Other Ambulatory Visit: Payer: Self-pay

## 2022-09-16 DIAGNOSIS — I10 Essential (primary) hypertension: Secondary | ICD-10-CM

## 2022-09-16 MED ORDER — AMLODIPINE BESYLATE 5 MG PO TABS: 5.0000 mg | ORAL_TABLET | Freq: Every day | ORAL | 3 refills | Status: DC

## 2022-11-24 ENCOUNTER — Other Ambulatory Visit: Payer: Self-pay | Admitting: Physician Assistant

## 2022-11-24 DIAGNOSIS — I10 Essential (primary) hypertension: Secondary | ICD-10-CM

## 2022-12-12 ENCOUNTER — Ambulatory Visit: Payer: 59 | Admitting: Physician Assistant

## 2022-12-12 ENCOUNTER — Encounter: Payer: Self-pay | Admitting: Physician Assistant

## 2022-12-12 DIAGNOSIS — J3489 Other specified disorders of nose and nasal sinuses: Secondary | ICD-10-CM

## 2022-12-12 LAB — POCT INFLUENZA A/B
Influenza A, POC: NEGATIVE
Influenza B, POC: NEGATIVE

## 2022-12-12 LAB — POC COVID19 BINAXNOW: SARS Coronavirus 2 Ag: NEGATIVE

## 2022-12-12 MED ORDER — BENZONATATE 200 MG PO CAPS: 200.0000 mg | ORAL_CAPSULE | Freq: Three times a day (TID) | ORAL | 0 refills | Status: DC | PRN

## 2022-12-12 MED ORDER — FEXOFENADINE-PSEUDOEPHED ER 60-120 MG PO TB12: 1.0000 | ORAL_TABLET | Freq: Two times a day (BID) | ORAL | 0 refills | Status: DC

## 2022-12-12 NOTE — Progress Notes (Signed)
   Subjective: Nasal congestion    Patient ID: Brandon Kim, male    DOB: 10-29-1976, 46 y.o.   MRN: [id number]  HPI Patient complains of 2 days of nasal congestion, postnasal drainage causing sore throat, and mild cough.  Patient states 2 home COVID test was negative.  Patient tested negative for COVID and influenza today.  Denies recent travel or known contact with COVID-19.   Review of Systems Negative except for above complaint    Objective:   Physical Exam  This was a telephonic encounter      Assessment & Plan: Upper respiratory infection   Patient given a prescription for fexofenadine and Tessalon Perles.  Patient advised to follow-up with no improvement or worsening complaint.

## 2022-12-12 NOTE — Progress Notes (Signed)
S/Sx started 12/09/2022: Nasal drainage - yellow/green phlegm  Denies fever, cough, headache, facial pain/pressure, sore throat  States he's done two home covid tests that were negative  Taking OTC Advil Cold/Flu/Sinus  AMD

## 2022-12-19 ENCOUNTER — Other Ambulatory Visit: Payer: Self-pay | Admitting: Physician Assistant

## 2022-12-19 MED ORDER — CLARITHROMYCIN 500 MG PO TABS: 500.0000 mg | ORAL_TABLET | Freq: Two times a day (BID) | ORAL | 0 refills | Status: DC

## 2022-12-19 MED ORDER — FEXOFENADINE-PSEUDOEPHED ER 60-120 MG PO TB12: 1.0000 | ORAL_TABLET | Freq: Two times a day (BID) | ORAL | 0 refills | Status: DC

## 2023-02-20 ENCOUNTER — Other Ambulatory Visit: Payer: Self-pay

## 2023-02-20 DIAGNOSIS — E7849 Other hyperlipidemia: Secondary | ICD-10-CM

## 2023-02-20 MED ORDER — ATORVASTATIN CALCIUM 40 MG PO TABS: 40.0000 mg | ORAL_TABLET | Freq: Every day | ORAL | 3 refills | Status: DC

## 2023-02-27 ENCOUNTER — Encounter: Payer: 59 | Admitting: Physician Assistant

## 2023-03-11 ENCOUNTER — Ambulatory Visit: Payer: Self-pay

## 2023-03-11 DIAGNOSIS — Z0000 Encounter for general adult medical examination without abnormal findings: Secondary | ICD-10-CM

## 2023-03-11 LAB — POCT URINALYSIS DIPSTICK
Bilirubin, UA: NEGATIVE
Blood, UA: NEGATIVE
Glucose, UA: NEGATIVE
Ketones, UA: NEGATIVE
Leukocytes, UA: NEGATIVE
Nitrite, UA: NEGATIVE
Protein, UA: POSITIVE — AB
Spec Grav, UA: 1.025 (ref 1.010–1.025)
Urobilinogen, UA: 0.2 E.U./dL
pH, UA: 6 (ref 5.0–8.0)

## 2023-03-12 LAB — CMP12+LP+TP+TSH+6AC+PSA+CBC…
ALT: 36 IU/L (ref 0–44)
AST: 19 IU/L (ref 0–40)
Albumin/Globulin Ratio: 2 (ref 1.2–2.2)
Albumin: 4.6 g/dL (ref 4.1–5.1)
Alkaline Phosphatase: 90 IU/L (ref 44–121)
BUN/Creatinine Ratio: 20 (ref 9–20)
BUN: 16 mg/dL (ref 6–24)
Basophils Absolute: 0.1 10*3/uL (ref 0.0–0.2)
Basos: 2 %
Bilirubin Total: 0.5 mg/dL (ref 0.0–1.2)
Calcium: 9.4 mg/dL (ref 8.7–10.2)
Chloride: 107 mmol/L — ABNORMAL HIGH (ref 96–106)
Chol/HDL Ratio: 5.6 ratio — ABNORMAL HIGH (ref 0.0–5.0)
Cholesterol, Total: 190 mg/dL (ref 100–199)
Creatinine, Ser: 0.79 mg/dL (ref 0.76–1.27)
EOS (ABSOLUTE): 0.1 10*3/uL (ref 0.0–0.4)
Eos: 2 %
Estimated CHD Risk: 1.2 times avg. — ABNORMAL HIGH (ref 0.0–1.0)
Free Thyroxine Index: 2.1 (ref 1.2–4.9)
GGT: 50 IU/L (ref 0–65)
Globulin, Total: 2.3 g/dL (ref 1.5–4.5)
Glucose: 106 mg/dL — ABNORMAL HIGH (ref 70–99)
HDL: 34 mg/dL — ABNORMAL LOW (ref >39)
Hematocrit: 44.4 % (ref 37.5–51.0)
Hemoglobin: 15.1 g/dL (ref 13.0–17.7)
Immature Grans (Abs): 0 10*3/uL (ref 0.0–0.1)
Immature Granulocytes: 0 %
Iron: 57 ug/dL (ref 38–169)
LDH: 140 IU/L (ref 121–224)
LDL Chol Calc (NIH): 135 mg/dL — ABNORMAL HIGH (ref 0–99)
Lymphocytes Absolute: 1.7 10*3/uL (ref 0.7–3.1)
Lymphs: 28 %
MCH: 31.3 pg (ref 26.6–33.0)
MCHC: 34 g/dL (ref 31.5–35.7)
MCV: 92 fL (ref 79–97)
Monocytes Absolute: 0.6 10*3/uL (ref 0.1–0.9)
Monocytes: 10 %
Neutrophils Absolute: 3.5 10*3/uL (ref 1.4–7.0)
Neutrophils: 58 %
Phosphorus: 2.8 mg/dL (ref 2.8–4.1)
Platelets: 194 10*3/uL (ref 150–450)
Potassium: 4.2 mmol/L (ref 3.5–5.2)
Prostate Specific Ag, Serum: 0.4 ng/mL (ref 0.0–4.0)
RBC: 4.82 x10E6/uL (ref 4.14–5.80)
RDW: 12.3 % (ref 11.6–15.4)
Sodium: 142 mmol/L (ref 134–144)
T3 Uptake Ratio: 29 % (ref 24–39)
T4, Total: 7.4 ug/dL (ref 4.5–12.0)
TSH: 1.39 u[IU]/mL (ref 0.450–4.500)
Total Protein: 6.9 g/dL (ref 6.0–8.5)
Triglycerides: 113 mg/dL (ref 0–149)
Uric Acid: 6 mg/dL (ref 3.8–8.4)
VLDL Cholesterol Cal: 21 mg/dL (ref 5–40)
WBC: 6 10*3/uL (ref 3.4–10.8)
eGFR: 110 mL/min/{1.73_m2} (ref >59)

## 2023-03-18 ENCOUNTER — Ambulatory Visit: Payer: Self-pay | Admitting: Physician Assistant

## 2023-03-18 ENCOUNTER — Encounter: Payer: Self-pay | Admitting: Physician Assistant

## 2023-03-18 VITALS — BP 143/100 | HR 74 | Temp 98.4°F | Resp 14 | Ht 76.0 in | Wt 248.0 lb

## 2023-03-18 DIAGNOSIS — E7849 Other hyperlipidemia: Secondary | ICD-10-CM

## 2023-03-18 DIAGNOSIS — I10 Essential (primary) hypertension: Secondary | ICD-10-CM

## 2023-03-18 DIAGNOSIS — Z0000 Encounter for general adult medical examination without abnormal findings: Secondary | ICD-10-CM

## 2023-03-18 DIAGNOSIS — R809 Proteinuria, unspecified: Secondary | ICD-10-CM

## 2023-03-18 MED ORDER — CLOTRIMAZOLE-BETAMETHASONE 1-0.05 % EX CREA: 1.0000 | TOPICAL_CREAM | Freq: Every day | CUTANEOUS | 0 refills | Status: DC

## 2023-03-18 NOTE — Progress Notes (Signed)
Pt presents today to complete physical, no concerns or issues at this time/CL,RMA 

## 2023-03-18 NOTE — Addendum Note (Signed)
Addended by: Aliene Altes on: 03/18/2023 03:08 PM   Modules accepted: Orders

## 2023-03-18 NOTE — Progress Notes (Signed)
[facility] ____________________________________________   None    (approximate)  I have reviewed the triage vital signs and the nursing notes.   HISTORY  Chief Complaint Annual Exam   HPI Brandon Kim is a 47 y.o. male patient presents annual physical exam.  Patient voiced no concerns or complaints.         Past Medical History:  Diagnosis Date   Asthma    Elevated BP without diagnosis of hypertension    Hyperlipidemia    Overweight    Sedentary lifestyle    Tobacco dependency     Patient Active Problem List   Diagnosis Date Noted   Elevated BP without diagnosis of hypertension 08/24/2019   Abrasion, elbow w/o infection 08/24/2019   Eye irritation 08/24/2019    Past Surgical History:  Procedure Laterality Date   TONSILLECTOMY      Prior to Admission medications   Medication Sig Start Date End Date Taking? Authorizing Provider  amLODipine (NORVASC) 5 MG tablet Take 1 tablet (5 mg total) by mouth daily. 09/16/22 09/11/23 Yes Sable Feil, PA-C  atorvastatin (LIPITOR) 40 MG tablet Take 1 tablet (40 mg total) by mouth daily. 02/20/23  Yes Sable Feil, PA-C  pantoprazole (PROTONIX) 40 MG tablet Take 1 tablet (40 mg total) by mouth daily. 05/23/22  Yes Sable Feil, PA-C    Allergies Patient has no known allergies.  Family History  Problem Relation Age of Onset   Heart attack Mother    Heart disease Mother    COPD Mother    Breast cancer Mother     Social History Social History   Tobacco Use   Smoking status: Every Day    Packs/day: 0.50    Years: 20.00    Additional pack years: 0.00    Total pack years: 10.00    Types: Cigarettes   Smokeless tobacco: Never   Tobacco comments:    Past two weeks has cut down to just a few cigs a day    1/4 - 1/2 pack per day - 04/25/1022  Vaping Use   Vaping Use: Never used  Substance Use Topics   Alcohol use: Yes    Comment: Weekends   Drug use: No    Review of  Systems Constitutional: No fever/chills Eyes: No visual changes. ENT: No sore throat. Cardiovascular: Denies chest pain. Respiratory: Denies shortness of breath. Gastrointestinal: No abdominal pain.  No nausea, no vomiting.  No diarrhea.  No constipation. Genitourinary: Negative for dysuria. Musculoskeletal: Negative for back pain. Skin: Negative for rash. Neurological: Negative for headaches, focal weakness or numbness. Endocrine: Hyperlipidemia and hypertension  ____________________________________________   PHYSICAL EXAM:  VITAL SIGNS: BP is 143/100, pulse 74, respiration 14, temperature 90.4, patient 97% O2 sat on room air.  Patient weighs 248 pounds BMI is 30.19. Constitutional: Alert and oriented. Well appearing and in no acute distress. Eyes: Conjunctivae are normal. PERRL. EOMI. Head: Atraumatic. Nose: No congestion/rhinnorhea. Mouth/Throat: Mucous membranes are moist.  Oropharynx non-erythematous. Neck: No stridor.  No cervical spine tenderness to palpation. Hematological/Lymphatic/Immunilogical: No cervical lymphadenopathy. Cardiovascular: Normal rate, regular rhythm. Grossly normal heart sounds.  Good peripheral circulation. Respiratory: Normal respiratory effort.  No retractions. Lungs CTAB. Gastrointestinal: Soft and nontender. No distention. No abdominal bruits. No CVA tenderness. Genitourinary: Deferred Musculoskeletal: No lower extremity tenderness nor edema.  No joint effusions. Neurologic:  Normal speech and language. No gross focal neurologic deficits are appreciated. No gait instability. Skin:  Skin is warm, dry and intact.  No rash noted. Psychiatric: Mood and affect are normal. Speech and behavior are normal.  ____________________________________________   LABS ____________________________________________  EKG  Normal sinus rhythm 68 bpm ____________________________________________    ____________________________________________   INITIAL  IMPRESSION / ASSESSMENT AND PLAN   As part of my medical decision making, I reviewed the following data within the electronic MEDICAL RECORD NUMBER      No acute findings on physical exam.  Discussed lab results showing persistent proteinuria over 4 years.  Patient is asymptomatic.  Will consult to nephrology for definitive evaluation.        ____________________________________________   FINAL CLINICAL IMPRESSION Well exam    ED Discharge Orders     None        Note:  This document was prepared using Dragon voice recognition software and may include unintentional dictation errors.

## 2023-03-24 ENCOUNTER — Other Ambulatory Visit: Payer: Self-pay | Admitting: Physician Assistant

## 2023-03-24 DIAGNOSIS — K219 Gastro-esophageal reflux disease without esophagitis: Secondary | ICD-10-CM

## 2023-04-09 DIAGNOSIS — H52213 Irregular astigmatism, bilateral: Secondary | ICD-10-CM | POA: Diagnosis not present

## 2023-04-29 ENCOUNTER — Other Ambulatory Visit: Payer: Self-pay

## 2023-05-06 ENCOUNTER — Other Ambulatory Visit: Payer: Self-pay

## 2023-05-06 DIAGNOSIS — K219 Gastro-esophageal reflux disease without esophagitis: Secondary | ICD-10-CM

## 2023-05-06 DIAGNOSIS — E785 Hyperlipidemia, unspecified: Secondary | ICD-10-CM | POA: Diagnosis not present

## 2023-05-06 DIAGNOSIS — R809 Proteinuria, unspecified: Secondary | ICD-10-CM | POA: Diagnosis not present

## 2023-05-06 DIAGNOSIS — I10 Essential (primary) hypertension: Secondary | ICD-10-CM | POA: Diagnosis not present

## 2023-05-06 DIAGNOSIS — R8290 Unspecified abnormal findings in urine: Secondary | ICD-10-CM | POA: Diagnosis not present

## 2023-05-06 MED ORDER — PANTOPRAZOLE SODIUM 40 MG PO TBEC
40.0000 mg | DELAYED_RELEASE_TABLET | Freq: Every day | ORAL | 3 refills | Status: DC
Start: 2023-05-06 — End: 2024-04-19

## 2023-05-08 ENCOUNTER — Ambulatory Visit: Payer: Self-pay | Admitting: Physician Assistant

## 2023-05-08 ENCOUNTER — Encounter: Payer: Self-pay | Admitting: Physician Assistant

## 2023-05-08 DIAGNOSIS — L247 Irritant contact dermatitis due to plants, except food: Secondary | ICD-10-CM

## 2023-05-08 MED ORDER — HYDROXYZINE PAMOATE 25 MG PO CAPS: 25.0000 mg | ORAL_CAPSULE | Freq: Three times a day (TID) | ORAL | 0 refills | Status: DC | PRN

## 2023-05-08 MED ORDER — METHYLPREDNISOLONE 4 MG PO TBPK: ORAL_TABLET | ORAL | 0 refills | Status: DC

## 2023-05-08 MED ORDER — METHYLPREDNISOLONE SODIUM SUCC 40 MG IJ SOLR
40.0000 mg | Freq: Once | INTRAMUSCULAR | Status: AC
Start: 2023-05-08 — End: 2023-05-08
  Administered 2023-05-08: 40 mg via INTRAMUSCULAR

## 2023-05-08 NOTE — Progress Notes (Signed)
   Subjective: Poison ivy    Patient ID: Brandon Kim, male    DOB: 09/22/1976, 47 y.o.   MRN: [id number]  HPI Patient complaining physical lesions secondary to contact with poison ivy 4 days ago.  Lesions on the upper and lower extremities and right lateral abdomen area.  Mild itching associated with contact.  Review of Systems Negative except for above complaint    Objective:   Physical Exam Multiple vesicular lesions on lower extremities right lateral abdomen area.       Assessment & Plan: Contact dermatitis   Patient given 40 mg of Solu-Medrol IM, followed by prescription Medrol Dosepak and Atarax.  Patient advised to follow-up if no improvement or worsening complaint.

## 2023-08-15 ENCOUNTER — Telehealth: Payer: Self-pay

## 2023-08-15 ENCOUNTER — Encounter: Payer: Self-pay | Admitting: Emergency Medicine

## 2023-08-15 ENCOUNTER — Emergency Department
Admission: EM | Admit: 2023-08-15 | Discharge: 2023-08-15 | Disposition: A | Discharge status: Home / Self Care | Payer: 59 | Attending: Emergency Medicine | Admitting: Emergency Medicine

## 2023-08-15 DIAGNOSIS — L509 Urticaria, unspecified: Secondary | ICD-10-CM | POA: Diagnosis not present

## 2023-08-15 DIAGNOSIS — T7840XA Allergy, unspecified, initial encounter: Secondary | ICD-10-CM | POA: Diagnosis not present

## 2023-08-15 DIAGNOSIS — T782XXA Anaphylactic shock, unspecified, initial encounter: Secondary | ICD-10-CM | POA: Insufficient documentation

## 2023-08-15 DIAGNOSIS — R0689 Other abnormalities of breathing: Secondary | ICD-10-CM | POA: Diagnosis not present

## 2023-08-15 DIAGNOSIS — T63461A Toxic effect of venom of wasps, accidental (unintentional), initial encounter: Secondary | ICD-10-CM | POA: Diagnosis not present

## 2023-08-15 DIAGNOSIS — R231 Pallor: Secondary | ICD-10-CM | POA: Diagnosis not present

## 2023-08-15 MED ORDER — EPINEPHRINE 0.3 MG/0.3ML IJ SOAJ: 0.3000 mg | INTRAMUSCULAR | 3 refills | Status: AC | PRN

## 2023-08-15 MED ORDER — PREDNISONE 20 MG PO TABS: 40.0000 mg | ORAL_TABLET | Freq: Every day | ORAL | 0 refills | Status: AC

## 2023-08-15 NOTE — Telephone Encounter (Signed)
Pt called stating he recently was stung by yellow jack and currently breakin out with hives for the first time. Per Charm Barges, RN and Myself advised pt to go to urgent care to get checked out due to first time having reaction to bee sting with no epi pen.

## 2023-08-15 NOTE — ED Provider Notes (Addendum)
[facility] Provider Note    Event Date/Time   First MD Initiated Contact with Patient 08/15/23 1354     (approximate)   History   Wasp sting  HPI  Brandon Kim is a 47 y.o. male with no significant past medical history who is sent to the ED due to allergic reaction.  Patient had no known previous allergies, was stung by a wasp at about 12:45 PM today.  Went to urgent care where he was diagnosed with anaphylaxis due to wheezing, hypotension, urticaria.  He was given Decadron IM, Benadryl 50 mg IM, EpiPen.  Currently he states that he feels much better.  Reports symptoms have resolved.     Physical Exam   Triage Vital Signs: ED Triage Vitals  Encounter Vitals Group     BP 08/15/23 1337 (!) 151/71     Systolic BP Percentile --      Diastolic BP Percentile --      Pulse Rate 08/15/23 1337 80     Resp 08/15/23 1337 18     Temp 08/15/23 1337 98.3 F (36.8 C)     Temp src --      SpO2 08/15/23 1337 94 %     Weight 08/15/23 1336 248 lb (112.5 kg)     Height 08/15/23 1336 6\' 4"  (1.93 m)     Head Circumference --      Peak Flow --      Pain Score 08/15/23 1336 0     Pain Loc --      Pain Education --      Exclude from Growth Chart --     Most recent vital signs: Vitals:   08/15/23 1337  BP: (!) 151/71  Pulse: 80  Resp: 18  Temp: 98.3 F (36.8 C)  SpO2: 94%    General: Awake, no distress.  Sitting upright CV:  Good peripheral perfusion.  Regular rate and rhythm Resp:  Normal effort.  Clear to auscultation bilaterally Abd:  No distention.  Other:  Strong clear voice, no oropharyngeal swelling. Generalized urticaria.   ED Results / Procedures / Treatments   Labs (all labs ordered are listed, but only abnormal results are displayed) Labs Reviewed - No data to display   RADIOLOGY    PROCEDURES:  Procedures   MEDICATIONS ORDERED IN ED: Medications - No data to display   IMPRESSION / MDM / ASSESSMENT AND PLAN / ED COURSE  I  reviewed the triage vital signs and the nursing notes.                             Patient with apparent anaphylactic reaction and signs of shock on initial presentation to urgent care.  He was treated for anaphylaxis, now feels much better.  Vital signs are normal in the ED.  Will observe for any relapsing symptoms.  Counseled patient on keeping EpiPen available in the future.  Prescription sent to pharmacy.    ----------------------------------------- 3:24 PM on 08/15/2023 ----------------------------------------- Patient remains asymptomatic and well-appearing.  Exam remains normal.  Wishes to be discharged immediately which is reasonable.     FINAL CLINICAL IMPRESSION(S) / ED DIAGNOSES   Final diagnoses:  Anaphylactic reaction to wasp sting, accidental or unintentional, initial encounter     Rx / DC Orders   ED Discharge Orders          Ordered    predniSONE (DELTASONE) 20 MG tablet  Daily with breakfast  08/15/23 1403    EPINEPHrine 0.3 mg/0.3 mL IJ SOAJ injection  As needed        08/15/23 1403             Note:  This document was prepared using Dragon voice recognition software and may include unintentional dictation errors.   Sharman Cheek, MD 08/15/23 1407    Sharman Cheek, MD 08/15/23 [phone]

## 2023-08-15 NOTE — ED Triage Notes (Signed)
Pt here  from [facility] with an allergic reaction to a bee sting broke out in hives and had a low b/p , b/p up with fluids was given epi prn 125 mg solumedrol  and  benadryl Iv

## 2023-08-16 ENCOUNTER — Other Ambulatory Visit: Payer: Self-pay | Admitting: Physician Assistant

## 2023-08-16 DIAGNOSIS — I10 Essential (primary) hypertension: Secondary | ICD-10-CM

## 2023-08-20 ENCOUNTER — Other Ambulatory Visit: Payer: Self-pay

## 2023-08-20 DIAGNOSIS — I10 Essential (primary) hypertension: Secondary | ICD-10-CM

## 2023-08-20 MED ORDER — AMLODIPINE BESYLATE 5 MG PO TABS
5.0000 mg | ORAL_TABLET | Freq: Every day | ORAL | 3 refills | Status: DC
Start: 2023-08-20 — End: 2024-05-14

## 2023-09-15 ENCOUNTER — Ambulatory Visit: Payer: Self-pay | Admitting: Physician Assistant

## 2023-09-15 ENCOUNTER — Ambulatory Visit
Admission: RE | Admit: 2023-09-15 | Discharge: 2023-09-15 | Disposition: A | Discharge status: Home / Self Care | Payer: Worker's Compensation | Source: Ambulatory Visit | Attending: Physician Assistant | Admitting: Physician Assistant

## 2023-09-15 ENCOUNTER — Ambulatory Visit
Admission: RE | Admit: 2023-09-15 | Discharge: 2023-09-15 | Disposition: A | Discharge status: Home / Self Care | Payer: Worker's Compensation | Attending: Physician Assistant | Admitting: Physician Assistant

## 2023-09-15 VITALS — BP 140/82 | HR 71 | Temp 98.6°F | Resp 16

## 2023-09-15 DIAGNOSIS — M25561 Pain in right knee: Secondary | ICD-10-CM

## 2023-09-15 NOTE — Progress Notes (Signed)
   Subjective:Right knee pain    Patient ID: Brandon Kim, male    DOB: Jan 19, 1976, 47 y.o.   MRN: [id number]  HPI    Review of Systems     Objective:   Physical Exam        Assessment & Plan:

## 2023-09-15 NOTE — Progress Notes (Signed)
Right knee pain level 6+ with movement and 0 at rest.  Stated injury while running steps on duty as [employer] on 09/08/23.  Stated NSAID minimal relief.  Stated minimal if any edema right knee and pain is pointing to medial and lateral sides of knee cap is location of pain.  Denies hx of knee injury.

## 2023-09-15 NOTE — Progress Notes (Signed)
   Subjective:Right knee pain    Patient ID: Brandon Kim, male    DOB: 1976/03/19, 47 y.o.   MRN: [id number]  HPI Patient presented for evaluation of right medial and lateral knee pain and swelling x 7 days secondary to twisting the knee while running on a work site. Patient is able to bear weight with mild discomfort. Reports having tried Aleve with no relief and heat with minimal relief.    Review of Systems  Constitutional: Negative.   Respiratory: Negative.    Cardiovascular: Negative.   Musculoskeletal:  Positive for joint swelling.       Right medial and lateral knee pain  Skin: Negative.   Neurological: Negative.        Objective:   Physical Exam Constitutional:      Appearance: He is not ill-appearing.  Cardiovascular:     Rate and Rhythm: Normal rate and regular rhythm.  Pulmonary:     Effort: Pulmonary effort is normal.     Breath sounds: Normal breath sounds.  Musculoskeletal:        General: Signs of injury present.     Comments: Right knee swelling  Skin:    General: Skin is warm and dry.  Neurological:     General: No focal deficit present.     Mental Status: He is alert.      Today's Vitals   09/15/23 1041  BP: (!) 140/82  Pulse: 71  Resp: 16  Temp: 98.6 F (37 C)  SpO2: 100%       Assessment & Plan:  Imaging ordered for the right knee and patient advised to limit weight baring to the extremity. Follow up in clinic on 09/18/2023.

## 2023-09-16 ENCOUNTER — Encounter: Payer: Self-pay | Admitting: Physician Assistant

## 2023-09-18 ENCOUNTER — Encounter: Payer: Self-pay | Admitting: Physician Assistant

## 2023-09-18 ENCOUNTER — Ambulatory Visit: Payer: Self-pay | Admitting: Physician Assistant

## 2023-09-18 VITALS — BP 140/80 | HR 82 | Temp 98.8°F | Resp 14

## 2023-09-18 DIAGNOSIS — M25561 Pain in right knee: Secondary | ICD-10-CM

## 2023-09-18 NOTE — Progress Notes (Signed)
Post injury right on 09/08/23 pain level 5 at the worst at this time noting some improvement.  Note mild limp with ambulation with very mild edema.  On light duty at this time and stated lack of ROM makes it stiff.

## 2023-09-18 NOTE — Progress Notes (Signed)
   Subjective:     Patient ID: Brandon Kim, male    DOB: 1976-04-18, 47 y.o.   MRN: [id number]  No chief complaint on file.   HPI Patient presents today for follow up on right lateral and medial knee pain. Pt was seen in this clinic on 09/15/2023 for evaluation of the right knee after experiencing pressure induced pain that began after twisting the knee while running at a work site. X-Ray imaging was ordered and results demonstrate mild effusion. On presentation today, patient reports improved pain, that at its worse is 5/10 but is 0/10 at this visit with decreased swelling. States that elevating extremity when sitting provides partial to complete relief.   Review of Systems  Constitutional: Negative.   Respiratory: Negative.    Cardiovascular: Negative.   Musculoskeletal:        Right lateral and medial knee pain  Skin: Negative.   Neurological: Negative.   Psychiatric/Behavioral: Negative.        Objective:    Today's Vitals   09/18/23 1041  BP: (!) 140/80  Pulse: 82  Resp: 14  Temp: 98.8 F (37.1 C)  SpO2: 98%    Physical Exam Constitutional:      Appearance: He is not ill-appearing.  Cardiovascular:     Rate and Rhythm: Normal rate and regular rhythm.  Pulmonary:     Effort: Pulmonary effort is normal.     Breath sounds: Normal breath sounds.  Musculoskeletal:        General: Swelling present. Normal range of motion.     Comments: Mild right knee swelling   Neurological:     General: No focal deficit present.     Mental Status: He is alert. Mental status is at baseline.  Psychiatric:        Mood and Affect: Mood normal.        Behavior: Behavior normal.       Assessment & Plan:   Problem List Items Addressed This Visit   None Visit Diagnoses     Acute pain of right knee    -  Primary      Patient advised to continue sedentary duty with return to regular duty on 09/22/2023 and present to clinic with any concerns or needs.   Delma Post, RN

## 2023-10-08 ENCOUNTER — Other Ambulatory Visit: Payer: Self-pay

## 2023-10-08 NOTE — Progress Notes (Signed)
UDS PENDING WITH [facility]  ETOH IS CLEARED.

## 2023-10-23 ENCOUNTER — Encounter: Payer: Self-pay | Admitting: Physician Assistant

## 2023-10-23 ENCOUNTER — Ambulatory Visit: Payer: Self-pay | Admitting: Physician Assistant

## 2023-10-23 VITALS — BP 140/92 | HR 75 | Temp 98.4°F | Resp 12

## 2023-10-23 DIAGNOSIS — M25461 Effusion, right knee: Secondary | ICD-10-CM

## 2023-10-23 MED ORDER — DICLOFENAC SODIUM 75 MG PO TBEC: 75.0000 mg | DELAYED_RELEASE_TABLET | Freq: Two times a day (BID) | ORAL | 0 refills | Status: DC

## 2023-10-23 NOTE — Progress Notes (Signed)
States right knee hasn't improved any since 09/18/2023 - it's not worse , but no better.  States ligament is causing him to walk incorrectly.  When sits for prolonged period of time, it gets stiff.  Intermittent sharp pain still occurring - not taking any OTC medications or Rx medications.  Ices it occasionally  No compression  Have never had an injury like this & I'm not sure how long it takes to get better.  Hasn't done any PT - only had th xray

## 2023-10-23 NOTE — Progress Notes (Signed)
   Subjective: Right lateral knee pain    Patient ID: Darrik Richman, male    DOB: Sep 21, 1976, 47 y.o.   MRN: [id number]  HPI Patient complaining of continued right lateral knee pain status post twisting incident which occurred on 09/15/2023.  X-ray reveals small effusion.  Patient states continues to ambulate with atypical gait.  Voice concern for the length of time healing process.   Review of Systems Negative except for above complaint    Objective:   Physical Exam BP 140/92  BP Location Left Arm  Patient Position Sitting  Cuff Size Large  Pulse 75  Resp 12  Temp 98.4 F (36.9 C)  Temp src Temporal  No acute distress.  Normal gait. No obvious deformity.  No edema, erythema, or ecchymosis.  Mild crepitus with palpation. Moderate guarding palpation to the insertion point of the LCL. Neurovascular intact with free Nikkel range of motion.  Grip against resistance 3/5.       Assessment & Plan: Right knee pain  Patient given prescription for Voltaren and a consult to physical therapy as for definitive evaluation and treatment.

## 2023-10-23 NOTE — Addendum Note (Signed)
Addended by: Gardner Candle on: 10/23/2023 09:46 AM   Modules accepted: Orders

## 2024-02-11 ENCOUNTER — Other Ambulatory Visit: Payer: Self-pay | Admitting: Physician Assistant

## 2024-02-11 DIAGNOSIS — E7849 Other hyperlipidemia: Secondary | ICD-10-CM

## 2024-03-04 ENCOUNTER — Ambulatory Visit: Payer: Self-pay

## 2024-03-04 DIAGNOSIS — Z0000 Encounter for general adult medical examination without abnormal findings: Secondary | ICD-10-CM

## 2024-03-04 LAB — POCT URINALYSIS DIPSTICK
Bilirubin, UA: NEGATIVE
Blood, UA: NEGATIVE
Glucose, UA: NEGATIVE
Ketones, UA: NEGATIVE
Leukocytes, UA: NEGATIVE
Nitrite, UA: NEGATIVE
Protein, UA: POSITIVE — AB
Spec Grav, UA: 1.01 (ref 1.010–1.025)
Urobilinogen, UA: 0.2 U/dL
pH, UA: 7.5 (ref 5.0–8.0)

## 2024-03-05 LAB — CMP12+LP+TP+TSH+6AC+PSA+CBC…
ALT: 24 IU/L (ref 0–44)
AST: 19 IU/L (ref 0–40)
Albumin: 4.5 g/dL (ref 4.1–5.1)
Alkaline Phosphatase: 91 IU/L (ref 44–121)
BUN/Creatinine Ratio: 15 (ref 9–20)
BUN: 12 mg/dL (ref 6–24)
Basophils Absolute: 0.1 10*3/uL (ref 0.0–0.2)
Basos: 1 %
Bilirubin Total: 0.7 mg/dL (ref 0.0–1.2)
Calcium: 9.1 mg/dL (ref 8.7–10.2)
Chloride: 100 mmol/L (ref 96–106)
Chol/HDL Ratio: 5.5 ratio — ABNORMAL HIGH (ref 0.0–5.0)
Cholesterol, Total: 186 mg/dL (ref 100–199)
Creatinine, Ser: 0.8 mg/dL (ref 0.76–1.27)
EOS (ABSOLUTE): 0.1 10*3/uL (ref 0.0–0.4)
Eos: 2 %
Estimated CHD Risk: 1.1 times avg. — ABNORMAL HIGH (ref 0.0–1.0)
Free Thyroxine Index: 2.6 (ref 1.2–4.9)
GGT: 35 IU/L (ref 0–65)
Globulin, Total: 2.3 g/dL (ref 1.5–4.5)
Glucose: 87 mg/dL (ref 70–99)
HDL: 34 mg/dL — ABNORMAL LOW (ref >39)
Hematocrit: 42.5 % (ref 37.5–51.0)
Hemoglobin: 14.7 g/dL (ref 13.0–17.7)
Immature Grans (Abs): 0 10*3/uL (ref 0.0–0.1)
Immature Granulocytes: 0 %
Iron: 80 ug/dL (ref 38–169)
LDH: 176 IU/L (ref 121–224)
LDL Chol Calc (NIH): 133 mg/dL — ABNORMAL HIGH (ref 0–99)
Lymphocytes Absolute: 1.8 10*3/uL (ref 0.7–3.1)
Lymphs: 31 %
MCH: 31.7 pg (ref 26.6–33.0)
MCHC: 34.6 g/dL (ref 31.5–35.7)
MCV: 92 fL (ref 79–97)
Monocytes Absolute: 0.5 10*3/uL (ref 0.1–0.9)
Monocytes: 9 %
Neutrophils Absolute: 3.4 10*3/uL (ref 1.4–7.0)
Neutrophils: 57 %
Phosphorus: 3.2 mg/dL (ref 2.8–4.1)
Platelets: 211 10*3/uL (ref 150–450)
Potassium: 4.6 mmol/L (ref 3.5–5.2)
Prostate Specific Ag, Serum: 0.4 ng/mL (ref 0.0–4.0)
RBC: 4.64 x10E6/uL (ref 4.14–5.80)
RDW: 12.3 % (ref 11.6–15.4)
Sodium: 138 mmol/L (ref 134–144)
T3 Uptake Ratio: 30 % (ref 24–39)
T4, Total: 8.8 ug/dL (ref 4.5–12.0)
TSH: 1.32 u[IU]/mL (ref 0.450–4.500)
Total Protein: 6.8 g/dL (ref 6.0–8.5)
Triglycerides: 106 mg/dL (ref 0–149)
Uric Acid: 6.3 mg/dL (ref 3.8–8.4)
VLDL Cholesterol Cal: 19 mg/dL (ref 5–40)
WBC: 5.9 10*3/uL (ref 3.4–10.8)
eGFR: 109 mL/min/{1.73_m2} (ref >59)

## 2024-03-10 ENCOUNTER — Encounter: Admitting: Physician Assistant

## 2024-03-11 ENCOUNTER — Encounter: Payer: Self-pay | Admitting: Physician Assistant

## 2024-03-11 ENCOUNTER — Ambulatory Visit: Payer: Self-pay | Admitting: Physician Assistant

## 2024-03-11 VITALS — BP 144/88 | HR 70 | Temp 98.0°F | Resp 14 | Ht 75.5 in | Wt 244.8 lb

## 2024-03-11 DIAGNOSIS — Z0000 Encounter for general adult medical examination without abnormal findings: Secondary | ICD-10-CM

## 2024-03-11 NOTE — Progress Notes (Signed)
 Pt presents today to complete physical, pt did not voice any concerns at this time. Brandon Kim

## 2024-03-11 NOTE — Progress Notes (Signed)
 [facility] ____________________________________________   None    (approximate)  I have reviewed the triage vital signs and the nursing notes.   HISTORY  Chief Complaint Annual Exam (Physical.)   HPI Brandon Kim is a 48 y.o. male patient presents for annual physical exam.  Patient is for no concerns.  Patient has a history of proteinuria and was evaluated by nephrology with no etiology established.         Past Medical History:  Diagnosis Date   Asthma    Elevated BP without diagnosis of hypertension    Hyperlipidemia    Overweight    Sedentary lifestyle    Tobacco dependency     Patient Active Problem List   Diagnosis Date Noted   Abnormal urine 05/06/2023   Hyperlipidemia, unspecified 05/06/2023   Hypertension 05/06/2023   Proteinuria, unspecified 05/06/2023   Elevated BP without diagnosis of hypertension 08/24/2019   Abrasion, elbow w/o infection 08/24/2019   Eye irritation 08/24/2019    Past Surgical History:  Procedure Laterality Date   TONSILLECTOMY      Prior to Admission medications   Medication Sig Start Date End Date Taking? Authorizing Provider  amLODipine (NORVASC) 5 MG tablet Take 1 tablet (5 mg total) by mouth daily. 08/20/23 08/14/24 Yes Joni Reining, PA-C  atorvastatin (LIPITOR) 40 MG tablet TAKE 1 TABLET BY MOUTH EVERY DAY 02/11/24  Yes Joni Reining, PA-C  diclofenac (VOLTAREN) 75 MG EC tablet Take 1 tablet (75 mg total) by mouth 2 (two) times daily. 10/23/23  Yes Joni Reining, PA-C  EPINEPHrine 0.3 mg/0.3 mL IJ SOAJ injection Inject 0.3 mg into the muscle as needed for anaphylaxis. Follow package instructions as needed for severe allergy or anaphylactic reaction. 08/15/23  Yes Sharman Cheek, MD  pantoprazole (PROTONIX) 40 MG tablet Take 1 tablet (40 mg total) by mouth daily. 05/06/23  Yes Joni Reining, PA-C    Allergies Bee venom and Wasp venom  Family History  Problem Relation Age of Onset    Heart attack Mother    Heart disease Mother    COPD Mother    Breast cancer Mother     Social History Social History   Tobacco Use   Smoking status: Every Day    Current packs/day: 0.50    Average packs/day: 0.5 packs/day for 20.0 years (10.0 ttl pk-yrs)    Types: Cigarettes   Smokeless tobacco: Never   Tobacco comments:    Past two weeks has cut down to just a few cigs a day    1/4 - 1/2 pack per day - 04/25/1022  Vaping Use   Vaping status: Never Used  Substance Use Topics   Alcohol use: Yes    Comment: Weekends   Drug use: No    Review of Systems Constitutional: No fever/chills Eyes: No visual changes. ENT: No sore throat. Cardiovascular: Denies chest pain. Respiratory: Denies shortness of breath. Gastrointestinal: No abdominal pain.  No nausea, no vomiting.  No diarrhea.  No constipation. Genitourinary: Negative for dysuria. Musculoskeletal: Negative for back pain. Skin: Negative for rash. Neurological: Negative for headaches, focal weakness or numbness. Endocrine: Hyperlipidemia and hypertension. Hematological/Lymphatic:  Allergic/Immunilogical: Bee/wasp venom ____________________________________________   PHYSICAL EXAM:  VITAL SIGNS: BP 144/88BP. 144/88. Data is abnormal. Taken on 03/11/24 3:15 PM  Cuff Size Large  Pulse Rate 70  Temp 98 F (36.7 C)  Temp Source Temporal  Weight 244 lb 12.8 oz (111 kg)  Height 6' 3.5" (1.918 m)  Resp  14  SpO2 95 %   BMI: 30.19 kg/m2  BSA: 2.43 m2   Eyes: Conjunctivae are normal. PERRL. EOMI. Head: Atraumatic. Nose: No congestion/rhinnorhea. Mouth/Throat: Mucous membranes are moist.  Oropharynx non-erythematous. Neck: No stridor. No cervical spine tenderness to palpation. Hematological/Lymphatic/Immunilogical: No cervical lymphadenopathy. Cardiovascular: Normal rate, regular rhythm. Grossly normal heart sounds.  Good peripheral circulation. Respiratory: Normal respiratory effort.  No retractions. Lungs  CTAB. Gastrointestinal: Soft and nontender. No distention. No abdominal bruits. No CVA tenderness. Genitourinary: Deferred Musculoskeletal: No lower extremity tenderness nor edema.  No joint effusions. Neurologic:  Normal speech and language. No gross focal neurologic deficits are appreciated. No gait instability. Skin:  Skin is warm, dry and intact. No rash noted. Psychiatric: Mood and affect are normal. Speech and behavior are normal.  ____________________________________________   LABS  Ref Range & Units (hover) 1 yr ago (03/11/23) 2 yr ago (01/22/22) 3 yr ago (01/03/21) 3 yr ago (05/16/20)  Color, UA Dark Yellow dark yellow yellow yellow  Clarity, UA Clear clear clear clear  Glucose, UA Negative Negative Negative Negative  Bilirubin, UA Negative negative negative negative  Ketones, UA Negative positive CM negative negative  Spec Grav, UA 1.025 1.025 >=1.030 Abnormal  >=1.030 Abnormal   Blood, UA Negative negative negative negative  pH, UA 6.0 6.0 5.5 5.5  Protein, UA Positive Abnormal  Positive Abnormal  CM Positive Abnormal  CM Positive Abnormal  CM  Comment: 1+  Urobilinogen, UA 0.2 0.2 0.2 0.2  Nitrite, UA Negative negative negative negative  Leukocytes, UA Negative Trace Abnormal  Negative Negative  Appearance  dark cloudy   Odor                 View All Conversations on this Encounter      Test Result Released: Yes (seen)   0 Result Notes  important suggestion  Newer results are available. Click to view them now.             Component Ref Range & Units (hover) 1 yr ago (03/11/23) 2 yr ago (01/22/22) 3 yr ago (01/03/21) 3 yr ago (05/16/20) 5 yr ago (02/16/19) 6 yr ago (11/26/17) 8 yr ago (10/24/15)  Glucose 106 High  101 High  103 High  R 92 R     Uric Acid 6.0 7.6 CM 7.2 CM 7.6 CM     Comment:            Therapeutic target for gout patients: <6.0  BUN 16 15 16 15      Creatinine, Ser 0.79 0.94 1.02 0.90     eGFR 110 101       BUN/Creatinine Ratio 20 16 16 17       Sodium 142 140 141 142     Potassium 4.2 4.1 4.3 4.2     Chloride 107 High  102 102 106     Calcium 9.4 10.0 9.6 9.2     Phosphorus 2.8 3.6 3.4 3.1     Total Protein 6.9 7.2 7.3 6.6     Albumin 4.6 4.7 R 4.4 R 4.3 R     Globulin, Total 2.3 2.5 2.9 2.3     Albumin/Globulin Ratio 2.0 1.9 1.5 1.9     Bilirubin Total 0.5 1.0 0.6 0.5     Alkaline Phosphatase 90 83 100 78 R     LDH 140 151 163 162     AST 19 22 33 18     ALT 36 35 58 High  31  GGT 50 60 89 High  74 High      Iron 57 98 87 122     Cholesterol, Total 190 216 High  194 221 High      Triglycerides 113 199 High  145 230 High  178 Abnormal  R 160 R 357 Abnormal  R  HDL 34 Low  34 Low  40 42 31 Abnormal  R 38 R 30 Abnormal  R  VLDL Cholesterol Cal 21 36 26 41 High      LDL Chol Calc (NIH) 135 High  146 High  128 High  138 High      Chol/HDL Ratio 5.6 High  6.4 High  CM 4.9 CM 5.3 High  CM     Comment:                                   T. Chol/HDL Ratio                                             Men  Women                               1/2 Avg.Risk  3.4    3.3                                   Avg.Risk  5.0    4.4                                2X Avg.Risk  9.6    7.1                                3X Avg.Risk 23.4   11.0  Estimated CHD Risk 1.2 High  1.3 High  CM 1.0 CM 1.1 High  CM     Comment: The CHD Risk is based on the T. Chol/HDL ratio. Other factors affect CHD Risk such as hypertension, smoking, diabetes, severe obesity, and family history of premature CHD.  TSH 1.390 1.460 1.540 1.470     T4, Total 7.4 8.4 7.4 6.1     T3 Uptake Ratio 29 28 33 28     Free Thyroxine Index 2.1 2.4 2.4 1.7     Prostate Specific Ag, Serum 0.4 0.5 CM 0.5 CM 0.4 CM     Comment: Roche ECLIA methodology. According to the American U[employer] Association, Serum PSA should decrease and remain at undetectable levels after radical prostatectomy. The AUA defines biochemical recurrence as an initial PSA value 0.2 ng/mL or greater followed  by a subsequent confirmatory PSA value 0.2 ng/mL or greater. Values obtained with different assay methods or kits cannot be used interchangeably. Results cannot be interpreted as absolute evidence of the presence or absence of malignant disease.  WBC 6.0 7.3 6.5 5.4     RBC 4.82 5.26 5.19 4.85     Hemoglobin 15.1 16.7 16.3 15.2     Hematocrit 44.4 47.5 47.6 44.3     MCV 92 90 92 91     MCH 31.3 31.7 31.4  31.3     MCHC 34.0 35.2 34.2 34.3     RDW 12.3 12.2 12.2 12.8     Platelets 194 210 206 195     Neutrophils 58 62 63 56     Lymphs 28 27 26 15      Monocytes 10 8 9 26      Eos 2 2 1 2      Basos 2 1 1 1      Neutrophils Absolute 3.5 4.5 4.0 3.1     Lymphocytes Absolute 1.7 2.0 1.7 0.8     Monocytes Absolute 0.6 0.6 0.6 1.4 High      EOS (ABSOLUTE) 0.1 0.1 0.1 0.1     Basophils Absolute 0.1 0.1 0.1 0.1     Immature Granulocytes 0 0 0 0     Immature Grans (Abs) 0.0 0.0 0.0 0.0                 ____________________________________________  EKG  Normal sinus rhythm at 68 bpm ____________________________________________    ____________________________________________   INITIAL IMPRESSION / ASSESSMENT AND PLAN  As part of my medical decision making, I reviewed the following data within the electronic MEDICAL RECORD NUMBER      No acute findings on physical exam, EKG, or labs.        ____________________________________________   FINAL CLINICAL IMPRESSION Well exam   ED Discharge Orders     None        Note:  This document was prepared using Dragon voice recognition software and may include unintentional dictation errors.

## 2024-04-18 ENCOUNTER — Other Ambulatory Visit: Payer: Self-pay | Admitting: Physician Assistant

## 2024-04-18 DIAGNOSIS — K219 Gastro-esophageal reflux disease without esophagitis: Secondary | ICD-10-CM

## 2024-04-19 ENCOUNTER — Other Ambulatory Visit: Payer: Self-pay

## 2024-04-19 DIAGNOSIS — K219 Gastro-esophageal reflux disease without esophagitis: Secondary | ICD-10-CM

## 2024-04-19 MED ORDER — PANTOPRAZOLE SODIUM 40 MG PO TBEC: 40.0000 mg | DELAYED_RELEASE_TABLET | Freq: Every day | ORAL | 3 refills | Status: DC

## 2024-05-14 ENCOUNTER — Other Ambulatory Visit: Payer: Self-pay

## 2024-05-14 DIAGNOSIS — I10 Essential (primary) hypertension: Secondary | ICD-10-CM

## 2024-05-14 MED ORDER — AMLODIPINE BESYLATE 5 MG PO TABS: 5.0000 mg | ORAL_TABLET | Freq: Every day | ORAL | 3 refills | Status: AC

## 2024-07-13 ENCOUNTER — Encounter: Payer: Self-pay | Admitting: Physician Assistant

## 2024-07-13 ENCOUNTER — Ambulatory Visit: Payer: Self-pay | Admitting: Physician Assistant

## 2024-07-13 VITALS — HR 92 | Temp 98.2°F | Resp 16

## 2024-07-13 DIAGNOSIS — L247 Irritant contact dermatitis due to plants, except food: Secondary | ICD-10-CM

## 2024-07-13 MED ORDER — METHYLPREDNISOLONE 4 MG PO TBPK: ORAL_TABLET | ORAL | 0 refills | Status: AC

## 2024-07-13 MED ORDER — METHYLPREDNISOLONE SODIUM SUCC 40 MG IJ SOLR
40.0000 mg | Freq: Once | INTRAMUSCULAR | Status: AC
Start: 2024-07-13 — End: 2024-07-13
  Administered 2024-07-13: 80 mg via INTRAMUSCULAR

## 2024-07-13 MED ORDER — HYDROXYZINE PAMOATE 25 MG PO CAPS: 25.0000 mg | ORAL_CAPSULE | Freq: Three times a day (TID) | ORAL | 0 refills | Status: AC | PRN

## 2024-07-13 NOTE — Progress Notes (Signed)
   Subjective: Rash    Patient ID: Brandon Kim, male    DOB: 05-04-1976, 48 y.o.   MRN: [id number]  HPI Patient presents with bilateral upper extremity rash.  Patient also has satellite lesions on the left lateral neck area.  Patient believes he came in contact with  poison oak or poison ivy.  Similar vesicle lesions are leaking and he has intense itching.   Review of Systems Khary, hyperlipidemia, and hypertension.    Objective:   Physical Exam ulse Rate 92  Temp 98.2 F (36.8 C)  Resp 16  SpO2 98 %  Papular/vesicular lesions bilateral upper extremity.  Patient has same lesion on the left lateral neck.       Assessment & Plan: Contact dermatitis   Patient given Solu-Medrol  in clinic.  Patient given a prescription for Atarax  and Medrol  Dosepak.  Advised to follow-up in 3 to 5 days if no improvement or worsening complaint.

## 2024-07-13 NOTE — Addendum Note (Signed)
 Addended by: CINDIE SMILES F on: 07/13/2024 01:41 PM   Modules accepted: Orders

## 2024-07-13 NOTE — Progress Notes (Signed)
 Poison rash noted left arm and right arm and neck x 3 days and spreading.  Hx of worsening rashes.

## 2024-12-07 ENCOUNTER — Ambulatory Visit: Payer: Self-pay | Admitting: Physician Assistant

## 2024-12-07 ENCOUNTER — Encounter: Payer: Self-pay | Admitting: Physician Assistant

## 2024-12-07 VITALS — BP 153/100 | HR 74 | Resp 16 | Ht 75.5 in | Wt 242.0 lb

## 2024-12-07 DIAGNOSIS — D229 Melanocytic nevi, unspecified: Secondary | ICD-10-CM

## 2024-12-07 NOTE — Progress Notes (Signed)
 Pt presents today concerned with some moles/skin tags from friction of vest and has  mole on his back he would like looked at. /CL,RMA

## 2024-12-07 NOTE — Progress Notes (Signed)
" ° °  Subjective:Nevi    Patient ID: Brandon Kim, male    DOB: 06-06-1976, 48 y.o.   MRN: [id number]  HPI Patient voices concern for multiple nevi lesion.  States has not noticed any change in morphology or color.  Patient also suffers from intermitting episodes of versicolor.  States this condition is easily controlled with over-the-counter Selsun shampoo.  States she is adequate sunscreen protection during the summer months. Review of Systems     Objective:   Physical Exam  12/07/2024   10:33 AM  BP 153/100  Cuff Size Large  Pulse Rate 74  Weight 242 lb (109.8 kg)  Height 6' 3.5 (1.918 m)  Resp 16  SpO2 98 %  Hypopigmentation lesions on anterior posterior trunk.  Patient has multiple Nevi lesions anterior and posterior trunk.  Lesions are small and mostly brown in color.  No irregular borders.       Assessment & Plan: Multiple benign nevi   Patient advised  to monitor lesions for any change in morphology or color. "
